# Patient Record
Sex: Female | Born: 1970
Health system: Southern US, Community
[De-identification: ages and names within clinical notes are randomized; demographics above are authoritative.]

## PROBLEM LIST (undated history)

## (undated) DIAGNOSIS — R9431 Abnormal electrocardiogram [ECG] [EKG]: Secondary | ICD-10-CM

## (undated) DIAGNOSIS — F419 Anxiety disorder, unspecified: Secondary | ICD-10-CM

## (undated) DIAGNOSIS — E079 Disorder of thyroid, unspecified: Secondary | ICD-10-CM

## (undated) DIAGNOSIS — I10 Essential (primary) hypertension: Secondary | ICD-10-CM

## (undated) DIAGNOSIS — F329 Major depressive disorder, single episode, unspecified: Secondary | ICD-10-CM

## (undated) DIAGNOSIS — Z8619 Personal history of other infectious and parasitic diseases: Secondary | ICD-10-CM

## (undated) DIAGNOSIS — F32A Depression, unspecified: Secondary | ICD-10-CM

## (undated) DIAGNOSIS — I160 Hypertensive urgency: Secondary | ICD-10-CM

## (undated) DIAGNOSIS — A630 Anogenital (venereal) warts: Secondary | ICD-10-CM

## (undated) DIAGNOSIS — R079 Chest pain, unspecified: Secondary | ICD-10-CM

## (undated) HISTORY — DX: Depression, unspecified: F32.A

## (undated) HISTORY — DX: Anogenital (venereal) warts: A63.0

## (undated) HISTORY — DX: Personal history of other infectious and parasitic diseases: Z86.19

## (undated) HISTORY — DX: Essential (primary) hypertension: I10

## (undated) HISTORY — PX: ABDOMINAL HYSTERECTOMY: SHX81

## (undated) HISTORY — DX: Anxiety disorder, unspecified: F41.9

## (undated) HISTORY — PX: THYROIDECTOMY: SHX17

## (undated) HISTORY — DX: Major depressive disorder, single episode, unspecified: F32.9

---
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
## 1998-07-04 ENCOUNTER — Other Ambulatory Visit: Admission: RE | Admit: 1998-07-04 | Discharge: 1998-07-04 | Payer: Self-pay | Admitting: Obstetrics and Gynecology

## 1999-07-03 ENCOUNTER — Other Ambulatory Visit: Admission: RE | Admit: 1999-07-03 | Discharge: 1999-07-03 | Payer: Self-pay | Admitting: Obstetrics and Gynecology

## 2000-09-16 ENCOUNTER — Other Ambulatory Visit: Admission: RE | Admit: 2000-09-16 | Discharge: 2000-09-16 | Payer: Self-pay | Admitting: Obstetrics and Gynecology

## 2002-01-31 ENCOUNTER — Encounter: Payer: Self-pay | Admitting: Emergency Medicine

## 2002-01-31 ENCOUNTER — Inpatient Hospital Stay (HOSPITAL_COMMUNITY): Admission: EM | Admit: 2002-01-31 | Discharge: 2002-02-02 | Payer: Self-pay | Admitting: Emergency Medicine

## 2003-02-01 ENCOUNTER — Other Ambulatory Visit: Admission: RE | Admit: 2003-02-01 | Discharge: 2003-02-01 | Payer: Self-pay | Admitting: Obstetrics and Gynecology

## 2004-04-05 ENCOUNTER — Other Ambulatory Visit: Admission: RE | Admit: 2004-04-05 | Discharge: 2004-04-05 | Payer: Self-pay | Admitting: Obstetrics and Gynecology

## 2005-10-25 ENCOUNTER — Inpatient Hospital Stay (HOSPITAL_COMMUNITY): Admission: AD | Admit: 2005-10-25 | Discharge: 2005-10-25 | Payer: Self-pay | Admitting: Obstetrics and Gynecology

## 2006-05-09 ENCOUNTER — Emergency Department (HOSPITAL_COMMUNITY): Admission: EM | Admit: 2006-05-09 | Discharge: 2006-05-09 | Payer: Self-pay | Admitting: Emergency Medicine

## 2006-12-25 ENCOUNTER — Encounter: Admission: RE | Admit: 2006-12-25 | Discharge: 2006-12-25 | Payer: Self-pay | Admitting: Obstetrics and Gynecology

## 2009-09-16 ENCOUNTER — Encounter: Admission: RE | Admit: 2009-09-16 | Discharge: 2009-09-16 | Payer: Self-pay | Admitting: Emergency Medicine

## 2010-08-25 NOTE — Discharge Summary (Signed)
Emma James, TILMON NO.:  [id number]   MEDICAL RECORD NO.:  [id number]                   PATIENT TYPE:  INP   LOCATION:  0443                                 FACILITY:  [facility]   PHYSICIAN:  Sherin Quarry, MD                   DATE OF BIRTH:  1970-12-14   DATE OF ADMISSION:  01/31/2002  DATE OF DISCHARGE:  02/02/2002                                 DISCHARGE SUMMARY   HISTORY OF PRESENT ILLNESS:  The patient is a 40 year old woman who  presented to the hospital on 01/31/02, with a 48 hour history of increased  coughing, wheezing, and chest congestion associated with chest tightness.  Her cough has been productive of a small amount of phlegm.  She came to the  emergency room because she had a particularly bad night the night before  with increased shortness of breath.  In the emergency room, she was noted to  be moderately hypoxic, and to have pneumonia on chest x-ray.  She was  admitted for IV antibiotics, breathing treatments, and steroids.   ALLERGIES:  1. PENICILLIN.  2. SULFA DRUGS.   MEDICATIONS:  Celexa 20 mg q.d.   PHYSICAL EXAMINATION:  VITAL SIGNS:  Blood pressure 137/88, heart rate 120,  respirations 18.  HEENT:  Within normal limits.  CHEST:  Remarkable for mild diffuse expiratory wheezing with congestion at  the right base.  CARDIOVASCULAR:  Heart rate of 120.  There were no murmurs, rubs, or  gallops.  ABDOMEN:  Benign.  Normal bowel sounds without masses, tenderness, or  organomegaly.  NEUROLOGIC:  Normal.  EXTREMITIES:  Normal.   LABORATORY DATA:  Chest x-ray showed evidence of a right basilar infiltrate  consistent with pneumonia.  CBC revealed a white count of 7300, hemoglobin  13.7.  Initial blood gas obtained in the emergency room revealed a PO2 of  65, PCO2 of 34, pH of 7.44.  Subsequently, the patient underwent a BMET  which showed a potassium of 4.2, glucose was 152, probably secondary to the  stress of illness and  steroid therapy.   HOSPITAL COURSE:  By 02/02/02, following an inpatient regimen which  consisted of Solu-Medrol 80 mg IVq.8h., Zithromax 500 mg q.24h., Rocephin 2  g q.24h., and nebulizer therapy with albuterol, the patient was much  improved and was felt reasonable to treat her as an outpatient.  Therefore,  on 02/02/02, the patient was discharged.   DISCHARGE DIAGNOSES:  1. Outpatient acquired pneumonia.  2. Asthmatic bronchitis.  3. A 15 pack year smoking history.  4. Depression.  5. Penicillin and sulfa allergies.   DISCHARGE MEDICATIONS:  1. Celexa 20 mg q.d.  2. Prednisone following a tapering schedule, i.e., 40 mg x3, 30 mg x3, 20 mg     x3, 10 mg x3, then stop.  3. Zithromax 250 mg.  4. Ceftin 500 mg b.i.d. x7 days.  5. Tussionex  p.r.n. cough.  6. Ventolin meter dosed inhaler 3 puffs q.i.d.   FOLLOWUP:  [facility] in five to seven days.   ACTIVITY:  The patient was instructed to remain off work until she returns  to [employer].                                                 Sherin Quarry, MD    SY/MEDQ  D:  02/02/2002  T:  02/02/2002  Job:  [id number]   cc:   [facility]

## 2011-08-13 ENCOUNTER — Ambulatory Visit: Payer: Self-pay | Admitting: Family Medicine

## 2011-08-13 DIAGNOSIS — F329 Major depressive disorder, single episode, unspecified: Secondary | ICD-10-CM

## 2011-08-13 DIAGNOSIS — F32A Depression, unspecified: Secondary | ICD-10-CM

## 2011-08-13 DIAGNOSIS — F341 Dysthymic disorder: Secondary | ICD-10-CM

## 2011-08-13 MED ORDER — SERTRALINE HCL 100 MG PO TABS: 100.0000 mg | ORAL_TABLET | Freq: Every day | ORAL | Status: DC

## 2011-08-13 NOTE — Progress Notes (Signed)
  Subjective:    Patient ID: Emma James, female    DOB: 09-May-1970, 41 y.o.   MRN: [id number]  HPI 41 yo female with depression/anxiety.  On zoloft for several years, prior to that on lexapro.  Total on meds about 15 years.  Doing well on zoloft.  Ran out 3 days ago, though, and has felt worse since running out.   No other complaints today.    Review of Systems Negative except as per HPI     Objective:   Physical Exam  Constitutional: She appears well-developed and well-nourished.  Cardiovascular: Normal rate, regular rhythm, normal heart sounds and intact distal pulses.   No murmur heard. Pulmonary/Chest: Effort normal and breath sounds normal.  Neurological: She is alert.  Skin: Skin is warm and dry.          Assessment & Plan:  Depression/anxiety - refilled zoloft for 1 year.

## 2011-08-23 ENCOUNTER — Ambulatory Visit: Payer: Self-pay | Admitting: Physician Assistant

## 2011-08-23 ENCOUNTER — Encounter: Payer: Self-pay | Admitting: Physician Assistant

## 2011-08-23 DIAGNOSIS — L659 Nonscarring hair loss, unspecified: Secondary | ICD-10-CM

## 2011-08-23 DIAGNOSIS — F419 Anxiety disorder, unspecified: Secondary | ICD-10-CM

## 2011-08-23 DIAGNOSIS — F411 Generalized anxiety disorder: Secondary | ICD-10-CM

## 2011-08-23 MED ORDER — CLONAZEPAM 0.5 MG PO TABS: 0.5000 mg | ORAL_TABLET | Freq: Two times a day (BID) | ORAL | Status: DC | PRN

## 2011-08-23 NOTE — Progress Notes (Signed)
  Subjective:    Patient ID: Emma James, female    DOB: October 01, 1970, 41 y.o.   MRN: [id number]  HPI Patient presents with concerns about hair loss. States she has had a history of similar episodes with exacerbations of her anxiety. States she has been taking Lexapro and has tolerated it well. However she was out for 3 days before she was able to get her last refill, and on the 3rd day she felt like she was having increased anxiety.  Says that her anxiety worsens with these episodes of hair loss to the point that she is having nightmares and heart palpitations. Reports history of Klonopin prn which she tolerated well. Has seen a dermatologist in the past for evaluation of her hair loss which was treated with what she believes to have been steroid injections to promote growth. Subsequently her hair grew back and she had no further problems until now.     Review of Systems  Constitutional: Negative for unexpected weight change.  Respiratory: Negative for chest tightness.   Cardiovascular: Positive for palpitations.  Skin:       Hair loss        Objective:   Physical Exam  Vitals reviewed. Constitutional: She appears well-developed and well-nourished.  HENT:  Head: Normocephalic and atraumatic.  Eyes: Conjunctivae are normal.  Cardiovascular: Normal rate, regular rhythm and normal heart sounds.   Pulmonary/Chest: Effort normal and breath sounds normal.  Skin: Skin is warm and dry.     Psychiatric: Her speech is normal and behavior is normal. Judgment and thought content normal. Her mood appears anxious. Cognition and memory are normal.          Assessment & Plan:  1. Alopecia  Reasurrance provided. If symptoms worsen consider dermatology referral if able, however  likely will be self-limited 2. Anxiety, unspecified  Increase to Zoloft 150 mg daily, so she will be taking 1 1/2 of her current medication. She will  continue this for 4 weeks and follow up at that time via phone  call. If tolerating well, ok to send  new prescription with instructions to take 150 mg daily

## 2011-08-23 NOTE — Progress Notes (Signed)
Precepted with Ms. Marte, PA-C and agree.  

## 2011-09-04 ENCOUNTER — Telehealth: Payer: Self-pay

## 2011-09-04 MED ORDER — SERTRALINE HCL 100 MG PO TABS: ORAL_TABLET | ORAL | Status: DC

## 2011-09-04 NOTE — Telephone Encounter (Signed)
PT STATES CHELLE HAD INCREASED HER ZOLOFT AND NOW SHE IS OUT, THE PHARMACY STATES IT WAS DENIED, BUT THEY DIDN'T REALIZED IT HAD BEEN INCREASED SO SHE HAD TO  TAKE MORE. PLEASE CALL [phone]    [facility] [address]

## 2011-09-04 NOTE — Telephone Encounter (Signed)
Done. Please remind patient to call as directed with an update.

## 2011-09-05 NOTE — Telephone Encounter (Signed)
LMOM to call back

## 2011-09-05 NOTE — Telephone Encounter (Signed)
Told pt that new Rx for Zoloft was sent yesterday w/ new sig and #45 to last the month.

## 2011-09-08 ENCOUNTER — Other Ambulatory Visit: Payer: Self-pay | Admitting: *Deleted

## 2011-09-08 MED ORDER — CLONAZEPAM 0.5 MG PO TABS: 0.5000 mg | ORAL_TABLET | Freq: Two times a day (BID) | ORAL | Status: DC | PRN

## 2011-09-10 ENCOUNTER — Other Ambulatory Visit: Payer: Self-pay | Admitting: Physician Assistant

## 2011-09-10 MED ORDER — CLONAZEPAM 0.5 MG PO TABS: 0.5000 mg | ORAL_TABLET | Freq: Two times a day (BID) | ORAL | Status: DC | PRN

## 2011-09-26 ENCOUNTER — Telehealth: Payer: Self-pay

## 2011-09-26 NOTE — Telephone Encounter (Signed)
PT STATES SHE HAD SEEN CHELLE AND WAS TO CALL BACK IN A MONTH AND CHECK IN WITH HER TO GIVE UPDATE ON HOW SHE IS DOING PLEASE CALL [phone]

## 2011-09-26 NOTE — Telephone Encounter (Signed)
Spoke with pt she is doing a lot better but she does think she can feel more better. She is having to take 2 klonopin at night to go to sleep. She thinks we should increase her Zoloft, she is on 150 mg for the last month. Please advise

## 2011-09-26 NOTE — Telephone Encounter (Signed)
LMOM to CB. 

## 2011-09-26 NOTE — Telephone Encounter (Signed)
LMOM to call back

## 2011-09-26 NOTE — Telephone Encounter (Signed)
Ok to increase Zoloft to 200 mg daily.  Zoloft (sertraline) 100 mg, 2 PO QD, #60, RF x 3

## 2011-09-27 ENCOUNTER — Telehealth: Payer: Self-pay | Admitting: Family Medicine

## 2011-09-27 MED ORDER — SERTRALINE HCL 100 MG PO TABS: ORAL_TABLET | ORAL | Status: DC

## 2011-09-27 NOTE — Telephone Encounter (Signed)
[name], I'm forwarding this to you, as you last saw her.

## 2011-09-27 NOTE — Telephone Encounter (Signed)
[facility] LMOM to notify pt.

## 2011-09-27 NOTE — Telephone Encounter (Signed)
Spoke with pt she would also like an increase in Klonopin also since she has to take two to go to sleep. Please advise. Zoloft already sent to pharmacy, see previous note

## 2011-09-28 MED ORDER — CLONAZEPAM 1 MG PO TABS: 1.0000 mg | ORAL_TABLET | Freq: Every day | ORAL | Status: DC | PRN

## 2011-09-28 NOTE — Telephone Encounter (Signed)
Called in [facility] [address] and notified pt

## 2011-09-28 NOTE — Telephone Encounter (Signed)
Klonopin increased to 1 mg daily as needed.

## 2011-09-28 NOTE — Telephone Encounter (Signed)
Received call from [facility] advising [place] that they are not filling Rx for Klonopin d/t pt has already p/up #60 of the 0.5 mg on 6/1 and another #60 of the ) 0.5 on 6/[facility]. They will put Rx on hold for pt, but even at increased dose she should not be out yet. [name], I am forwarding this to you [facility]

## 2011-10-05 ENCOUNTER — Telehealth: Payer: Self-pay

## 2011-10-05 NOTE — Telephone Encounter (Signed)
PT WOULD LIKE TO SPEAK WITH A CLINICAL TL, STATES HER MEDICINE IS ALL SCREWED UP. WENT TO THE PHARMACY AND WHEN SHE GOT THEM, THAT IS WHEN SHE REALIZED THEY WERE MESSED UP. PLEASE CALL [phone]

## 2011-10-05 NOTE — Telephone Encounter (Signed)
Emma James , see also phone message from 6/20

## 2011-10-05 NOTE — Telephone Encounter (Signed)
Clarified with Pharmacy: Clonazepam 0.5 mg 1/2 tab QAM, 1/2 tab Qmidday, 2 tabs PO QHS.  Zoloft 100 mg 2 PO QD

## 2011-10-05 NOTE — Telephone Encounter (Signed)
[facility] called and asked whether Rx written on 6/21 should have been for 1 mg BID instead of 1 mg QD since she had been taking the 0.5 BID. She stated that the pt has amnesia about picking up medication. She doesn't remember that she p/up and signed/showed ID for Xanax twice already in June. They are uncomfortable filling this Rx bc they stated that pt should still not be out even if taking twice as much of the 0.5 she p/up in June. Spoke w/[name] who stated that pt really needs to RTC to follow-up w/Chelle if there is this much confusion.

## 2011-10-05 NOTE — Telephone Encounter (Signed)
Pt reports that the Zoloft Rx was sent in w/correct # for her to take two tabs QD for total of 200 mg Chelle was going to inc her to, but the sig was left the same to take 1 and 1/2, so the pharmacy dispensed #45. She would appreciate it if we can straighten that out so that she can inc that and she is hoping that she won't have to cont taking the inc amount of Klonopin Qhs. She reports she has been taking 1/2 of 0.5 mg Qam, 1/2 of 0.5 mg Q mid-day and 2 0.5 tabs Qhs. She is not out, but will be soon bc she has had to inc the nightly dose. Pt requests [place] to make the changes w/out OV if possible bc she has no insurance and Chelle had said she should report by phone in a month to avoid the extra OV. Pt is moving [place]OT tomorrow, but will be coming back and forth for a couple of months.

## 2011-12-01 ENCOUNTER — Telehealth: Payer: Self-pay

## 2011-12-01 MED ORDER — SERTRALINE HCL 100 MG PO TABS: ORAL_TABLET | ORAL | Status: DC

## 2011-12-01 MED ORDER — CLONAZEPAM 1 MG PO TABS: 1.0000 mg | ORAL_TABLET | Freq: Every day | ORAL | Status: DC | PRN

## 2011-12-01 NOTE — Telephone Encounter (Signed)
Zoloft sent with refills through 08/2012, one year from her last visit.  Rx for clonazepam printed.

## 2011-12-01 NOTE — Telephone Encounter (Signed)
Pt wants to know if chelle can write a her zoloft rx for a year and also needs refill on clonazepam Please call pt to advise

## 2011-12-02 NOTE — Telephone Encounter (Signed)
LMOM Rx sent to pharmacy

## 2012-01-09 ENCOUNTER — Other Ambulatory Visit: Payer: Self-pay

## 2012-01-09 MED ORDER — CLONAZEPAM 1 MG PO TABS: 1.0000 mg | ORAL_TABLET | Freq: Every day | ORAL | Status: DC | PRN

## 2012-03-08 ENCOUNTER — Telehealth: Payer: Self-pay

## 2012-03-08 NOTE — Telephone Encounter (Signed)
Recd a phone call from [place] [facility] [place]. States pt is requesting a refill on clonazepam 1mg  daily as needed. Per pharmacy, last filled on 02/06/12 Please call pharmacy at [phone]

## 2012-03-11 NOTE — Telephone Encounter (Signed)
Chelle- I am going to forward this on to you, I saw her last, but she is your patient. Probably will need an OV soon.

## 2012-03-12 ENCOUNTER — Telehealth: Payer: Self-pay

## 2012-03-12 NOTE — Telephone Encounter (Signed)
[phone]  clonazePAM (KLONOPIN) 1 MG tablet  Pharmacist is requesting refill in [place] for patient to get her meds refilled.  Phone is [phone]  Sent faxes beginning 03/08/12.

## 2012-03-13 ENCOUNTER — Telehealth: Payer: Self-pay

## 2012-03-13 NOTE — Telephone Encounter (Signed)
Pt CB to check status of request for RF on her Clonazepam. Chelle, please see phone message from 03/08/12.

## 2012-03-13 NOTE — Telephone Encounter (Signed)
This issue is being addressed in 03/08/12 phone message.

## 2012-03-14 MED ORDER — CLONAZEPAM 1 MG PO TABS: 1.0000 mg | ORAL_TABLET | Freq: Every day | ORAL | Status: DC | PRN

## 2012-03-14 NOTE — Telephone Encounter (Signed)
Sorry for the delay-I didn't get her original request! I've printed the Rx, but she needs to see me for additional fills.  When will she be back from [place]?

## 2012-03-14 NOTE — Telephone Encounter (Signed)
See other message

## 2012-03-14 NOTE — Telephone Encounter (Signed)
[facility] [place] and notified pt. Pt reported that she is living now in [place], but will be here in town the weekend after Christmas. I advised pt that she should est care where she is living, but we will be happy to see her that weekend. Advised that Chelle is not working that [employer], but Herbert Seta is. Pt agreed.

## 2015-01-25 ENCOUNTER — Ambulatory Visit (INDEPENDENT_AMBULATORY_CARE_PROVIDER_SITE_OTHER): Payer: Self-pay | Admitting: Physician Assistant

## 2015-01-25 ENCOUNTER — Encounter: Payer: Self-pay | Admitting: Physician Assistant

## 2015-01-25 VITALS — BP 123/82 | HR 83 | Temp 98.7°F | Resp 16 | Wt 140.2 lb

## 2015-01-25 DIAGNOSIS — F418 Other specified anxiety disorders: Secondary | ICD-10-CM

## 2015-01-25 DIAGNOSIS — F341 Dysthymic disorder: Secondary | ICD-10-CM

## 2015-01-25 DIAGNOSIS — F5105 Insomnia due to other mental disorder: Secondary | ICD-10-CM

## 2015-01-25 MED ORDER — SERTRALINE HCL 100 MG PO TABS: 200.0000 mg | ORAL_TABLET | Freq: Every day | ORAL | Status: DC

## 2015-01-25 MED ORDER — TRAZODONE HCL 150 MG PO TABS: 75.0000 mg | ORAL_TABLET | Freq: Every day | ORAL | Status: DC

## 2015-01-25 NOTE — Patient Instructions (Addendum)
[facility] - for sliding scale
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
## 2015-07-14 ENCOUNTER — Ambulatory Visit: Payer: Self-pay | Admitting: Physician Assistant

## 2015-07-27 ENCOUNTER — Ambulatory Visit: Payer: Self-pay | Admitting: Physician Assistant

## 2015-07-28 ENCOUNTER — Ambulatory Visit: Payer: Self-pay | Admitting: Physician Assistant

## 2015-08-04 ENCOUNTER — Other Ambulatory Visit: Payer: Self-pay | Admitting: Physician Assistant

## 2015-09-20 ENCOUNTER — Ambulatory Visit (INDEPENDENT_AMBULATORY_CARE_PROVIDER_SITE_OTHER): Payer: Self-pay | Admitting: Emergency Medicine

## 2015-09-20 VITALS — BP 122/76 | HR 79 | Temp 98.4°F | Resp 16 | Ht 64.5 in | Wt 144.0 lb

## 2015-09-20 DIAGNOSIS — F341 Dysthymic disorder: Secondary | ICD-10-CM

## 2015-09-20 DIAGNOSIS — F418 Other specified anxiety disorders: Secondary | ICD-10-CM

## 2015-09-20 DIAGNOSIS — F329 Major depressive disorder, single episode, unspecified: Secondary | ICD-10-CM

## 2015-09-20 DIAGNOSIS — F32A Depression, unspecified: Secondary | ICD-10-CM

## 2015-09-20 DIAGNOSIS — F5105 Insomnia due to other mental disorder: Secondary | ICD-10-CM

## 2015-09-20 MED ORDER — SERTRALINE HCL 100 MG PO TABS: 200.0000 mg | ORAL_TABLET | Freq: Every day | ORAL | Status: DC

## 2015-09-20 MED ORDER — TRAZODONE HCL 150 MG PO TABS: 75.0000 mg | ORAL_TABLET | Freq: Every day | ORAL | Status: DC

## 2015-09-20 NOTE — Patient Instructions (Addendum)
Please follow-up with Emma LennertSarah James in 6 months    IF you received an x-ray today, you will receive an invoice from [facility]. Please contact [facility] at [phone] with questions or concerns regarding your invoice.   IF you received labwork today, you will receive an invoice from [facility]. Please contact [facility] at [phone] with questions or concerns regarding your invoice.   Our billing staff will not be able to assist you with questions regarding bills from these companies.  You will be contacted with the lab results as soon as they are available. The fastest way to get your results is to activate your My Chart account. Instructions are located on the last page of this paperwork. If you have not heard from us regarding the results in 2 weeks, please contact this office.
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
## 2015-10-28 ENCOUNTER — Ambulatory Visit (HOSPITAL_COMMUNITY)
Admission: RE | Admit: 2015-10-28 | Discharge: 2015-10-28 | Disposition: A | Discharge status: Home / Self Care | Payer: Self-pay | Source: Ambulatory Visit | Attending: Obstetrics and Gynecology | Admitting: Obstetrics and Gynecology

## 2015-10-28 ENCOUNTER — Encounter (HOSPITAL_COMMUNITY): Payer: Self-pay | Admitting: *Deleted

## 2015-10-28 DIAGNOSIS — Z130 Encounter for screening for diseases of the blood and blood-forming organs and certain disorders involving the immune mechanism: Secondary | ICD-10-CM | POA: Insufficient documentation

## 2015-10-28 DIAGNOSIS — D649 Anemia, unspecified: Secondary | ICD-10-CM | POA: Insufficient documentation

## 2015-10-28 LAB — GLUCOSE, CAPILLARY: Glucose-Capillary: 99 mg/dL (ref 65–99)

## 2015-10-28 MED ORDER — SODIUM CHLORIDE 0.9 % IV SOLN
510.0000 mg | Freq: Once | INTRAVENOUS | Status: AC
  Administered 2015-10-28: 510 mg via INTRAVENOUS
  Filled 2015-10-28: qty 17

## 2015-10-28 MED ORDER — LACTATED RINGERS IV BOLUS (SEPSIS)
500.0000 mL | Freq: Once | INTRAVENOUS | Status: AC
Start: 2015-10-28 — End: 2015-10-28
  Administered 2015-10-28: 500 mL via INTRAVENOUS

## 2015-10-28 MED ORDER — LACTATED RINGERS IV SOLN: INTRAVENOUS | Status: DC

## 2015-10-28 NOTE — Progress Notes (Signed)
Pt ambulated with 2 person assist to bathroom; pt states legs are "buckling"; unstable gait observed; Pt alert, oriented; spoke with Amy at Dr. Lynnell DikeGrewal's office; will await new orders; will continue to monitor

## 2015-10-28 NOTE — Progress Notes (Signed)
Pt received 500ccs of lactated ringers and stated she felt better when she sat up. VSS, b/p 126/79, hr 85, temp 98.5, O2 sat 100% and respirations 16. Pt ambulated to bathroom with assistance, knees not as wobbly. The longer she was standing and walking the better her ambulation was. Pt denied discomfort. Able to walk on her own. Dr. Vincente PoliGrewal was notified regarding her situation and felt like patient could go home at this point. Pt was discharged but reminded to call EMS for any problems. Knows that she is to return on Monday for her next [place]. Pt voiced understanding.

## 2015-10-28 NOTE — Discharge Instructions (Signed)
Today, you received an IV infusion of Ferumoxytol (Feraheme), 510 mg.  Please contact your primary provider or the emergency room for any side effects noted below. Please contact your provider for follow-up.  Ferumoxytol injection What is this medicine? FERUMOXYTOL is an iron complex. Iron is used to make healthy red blood cells, which carry oxygen and nutrients throughout the body. This medicine is used to treat iron deficiency anemia in people with chronic kidney disease. This medicine may be used for other purposes; ask your health care provider or pharmacist if you have questions. What should I tell my health care provider before I take this medicine? They need to know if you have any of these conditions: -anemia not caused by low iron levels -high levels of iron in the blood -magnetic resonance imaging (MRI) test scheduled -an unusual or allergic reaction to iron, other medicines, foods, dyes, or preservatives -pregnant or trying to get pregnant -breast-feeding How should I use this medicine? This medicine is for injection into a vein. It is given by a health care professional in a hospital or clinic setting. Talk to your pediatrician regarding the use of this medicine in children. Special care may be needed. Overdosage: If you think you have taken too much of this medicine contact a poison control center or emergency room at once. NOTE: This medicine is only for you. Do not share this medicine with others. What if I miss a dose? It is important not to miss your dose. Call your doctor or health care professional if you are unable to keep an appointment. What may interact with this medicine? This medicine may interact with the following medications: -other iron products This list may not describe all possible interactions. Give your health care provider a list of all the medicines, herbs, non-prescription drugs, or dietary supplements you use. Also tell them if you smoke, drink alcohol,  or use illegal drugs. Some items may interact with your medicine. What should I watch for while using this medicine? Visit your doctor or healthcare professional regularly. Tell your doctor or healthcare professional if your symptoms do not start to get better or if they get worse. You may need blood work done while you are taking this medicine. You may need to follow a special diet. Talk to your doctor. Foods that contain iron include: whole grains/cereals, dried fruits, beans, or peas, leafy green vegetables, and organ meats (liver, kidney). What side effects may I notice from receiving this medicine? Side effects that you should report to your doctor or health care professional as soon as possible: -allergic reactions like skin rash, itching or hives, swelling of the face, lips, or tongue -breathing problems -changes in blood pressure -feeling faint or lightheaded, falls -fever or chills -flushing, sweating, or hot feelings -swelling of the ankles or feet Side effects that usually do not require medical attention (Report these to your doctor or health care professional if they continue or are bothersome.): -diarrhea -headache -nausea, vomiting -stomach pain This list may not describe all possible side effects. Call your doctor for medical advice about side effects. You may report side effects to FDA at 1-800-FDA-1088. Where should I keep my medicine? This drug is given in a hospital or clinic and will not be stored at home. NOTE: This sheet is a summary. It may not cover all possible information. If you have questions about this medicine, talk to your doctor, pharmacist, or health care provider.    2016, Elsevier/Gold Standard. (2011-11-09 15:23:36)

## 2015-10-28 NOTE — Procedures (Signed)
[facility]  Procedure Note  Cecille RubinCarolyn Springfield UEA:[id number] DOB: [birth year] DOA: 10/28/2015   PCP: Jeani HawkingGREWAL,MICHELLE L, MD   Associated Diagnosis: Anemia, Z-130  Procedure Note: IV infusion of Feraheme; LR infusion    Condition During Procedure: Pt reported dizziness and unstable gait 30 minutes post Feraheme infusion; MD notified and orders received for infusion of Lactated ringers; Pt tolerated well  Condition at Discharge: Pt alert, oriented, ambulatory; transported by wheelchair to car; no complications noted   Katrinka BlazingSmith, Joslyn HyBobbie White, RN  [facility]

## 2015-10-28 NOTE — Progress Notes (Signed)
Spoke with Alvino ChapelEllen at Dr. Lynnell DikeGrewal's office regarding Emma James. Dr. Vincente PoliGrewal gave orders for IV fluids.  Will cont to monitor patient.

## 2015-10-28 NOTE — Progress Notes (Signed)
Pt received IV infusion of Fereheme; pt was monitored for 30 minutes post infusion; upon discharge, pt stated felt dizzy and lightheaded; IV signs, 136/76 BP, 100% oxygen, 86 HR, 16 RR, 98.4 temp; Dr. Lynnell DikeGrewal's office notified; no new orders received; pt given fluids and food, will continue to monitor

## 2015-10-31 ENCOUNTER — Ambulatory Visit (HOSPITAL_COMMUNITY)
Admission: RE | Admit: 2015-10-31 | Discharge: 2015-10-31 | Disposition: A | Discharge status: Home / Self Care | Payer: Self-pay | Source: Ambulatory Visit | Attending: Obstetrics and Gynecology | Admitting: Obstetrics and Gynecology

## 2015-10-31 MED ORDER — SODIUM CHLORIDE 0.9 % IV SOLN
510.0000 mg | Freq: Once | INTRAVENOUS | Status: AC
  Administered 2015-10-31: 510 mg via INTRAVENOUS
  Filled 2015-10-31: qty 17

## 2015-10-31 NOTE — Procedures (Signed)
[facility]  Procedure Note  Emma James KGS:[id number] DOB: July 15, 1970 DOA: 10/31/2015   PCP: Jeani Hawking, MD   Associated Diagnosis: z130  Procedure Note: Iv started, iron infused per order, Iv discontinued   Condition During Procedure: patient stable, no discomforts noted   Condition at Discharge: patient stable at discharge, able to ambulate without difficulty.   Lanae Boast, RN  [facility]

## 2015-10-31 NOTE — Discharge Instructions (Signed)

## 2015-11-14 ENCOUNTER — Other Ambulatory Visit: Payer: Self-pay | Admitting: Obstetrics and Gynecology

## 2015-11-14 HISTORY — PX: TOTAL ABDOMINAL HYSTERECTOMY: SHX209

## 2015-12-06 ENCOUNTER — Other Ambulatory Visit: Payer: Self-pay

## 2015-12-06 DIAGNOSIS — Z8742 Personal history of other diseases of the female genital tract: Secondary | ICD-10-CM

## 2015-12-06 NOTE — Progress Notes (Signed)
Per provider pt needs to have US scheduled prior to GYN appt.  US scheduled for September 8th @ 0800.  Front office to contact pt with US and GYN appt.

## 2015-12-16 ENCOUNTER — Ambulatory Visit (HOSPITAL_COMMUNITY): Payer: Self-pay

## 2015-12-21 ENCOUNTER — Encounter: Payer: Self-pay | Admitting: Physical Therapy

## 2015-12-21 ENCOUNTER — Ambulatory Visit: Payer: Self-pay | Attending: Obstetrics and Gynecology | Admitting: Physical Therapy

## 2015-12-21 DIAGNOSIS — M62838 Other muscle spasm: Secondary | ICD-10-CM | POA: Insufficient documentation

## 2015-12-21 DIAGNOSIS — M6281 Muscle weakness (generalized): Secondary | ICD-10-CM | POA: Insufficient documentation

## 2015-12-21 NOTE — Therapy (Signed)
[facility] [address], [address] [place], [place], [address] Phone: [phone]   Fax:  [phone]  Physical Therapy Evaluation  Patient Details  Name: Sherman Lipuma MRN: [id number] Date of Birth: April 02, 1971 Referring Provider: Dr. Marcelle Overlie  Encounter Date: 12/21/2015      PT End of Session - 12/21/15 1616    Visit Number 1   Date for PT Re-Evaluation 04/21/16   PT Start Time 1530   PT Stop Time 1616   PT Time Calculation (min) 46 min   Activity Tolerance Patient tolerated treatment well   Behavior During Therapy [facility] for tasks assessed/performed      History reviewed. No pertinent past medical history.  Past Surgical History:  Procedure Laterality Date  . TOTAL ABDOMINAL HYSTERECTOMY  11/14/2015    There were no vitals filed for this visit.       Subjective Assessment - 12/21/15 1536    Subjective Patient had a total hysterectomy abdominally on 11/14/2015.  Patient strained her abdomen on 11/16/2015. Patietn did this by going to the bathroom and holding onto the towel rack with support and feel onto the toilet.  Patient pulled the left abdominal wall. Patient is not working at the time. She is still on the pain pills. Patient is 5 weeks recovery. Patient lives alone.    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 Yes   Pain Score 4   without pain 9/10   Pain Location Abdomen   Pain Orientation Left   Pain Descriptors / Indicators Sharp;Shooting;Stabbing  abdomen is numb   Pain Type Acute pain   Pain Radiating Towards pain runs to left toes   Pain Onset More than a month ago   Pain Frequency Constant   Aggravating Factors  movement   Pain Relieving Factors pain medication, be still   Multiple Pain Sites No            [facility] PT Assessment - 12/21/15 0001      Assessment   Medical Diagnosis Abdominal strain post op   Referring Provider Dr. Marcelle Overlie   Onset Date/Surgical Date 11/16/15   Prior Therapy None     Precautions   Precautions Other (comment)   Precaution Comments 5 weeks s/p hysterectomy     Restrictions   Weight Bearing Restrictions No     Balance Screen   Has the patient fallen in the past 6 months No   Has the patient had a decrease in activity level because of a fear of falling?  No   Is the patient reluctant to leave their home because of a fear of falling?  No     Home Environment   Living Environment Private residence  lives in apartment above garage   Available Help at Discharge Other (Comment)  none   Type of Home Apartment   Home Access Stairs to enter   Entrance Stairs-Number of Steps 12   Entrance Stairs-Rails Right   Home Layout One level  kitchen is in the house.      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#     Cognition   Overall Cognitive Status Within Functional Limits for tasks assessed     Observation/Other Assessments   Observations surgical site is healed with glu left on it   Focus on  Therapeutic Outcomes (FOTO)  88% limitation     ROM / Strength   AROM / PROM / Strength AROM;Strength     Strength   Left Hip ABduction 4/5     Palpation   Palpation comment tenderness located in left psoas; along the right and left  pubovesical ligament                 Pelvic Floor Special Questions - 12/21/15 0001    Urinary Leakage No                  PT Education - 12/21/15 1615    Education provided Yes   Education Details abdominal massage, toileting technique, scar massage, use of heat on the area   Person(s) Educated Patient   Methods Explanation;Demonstration;Handout   Comprehension Returned demonstration;Verbalized understanding          PT Short Term Goals - 12/21/15 1626      PT SHORT TERM GOAL #1   Title independent with initial  HEP   Time 4   Period Weeks   Status New     PT SHORT TERM GOAL #2   Title understand correct toileting technique due to constipation   Time 4   Period Weeks   Status New     PT SHORT TERM GOAL #3   Title understand abdominal massage to reduce constipation and reduce muscle pain   Time 4   Period Weeks   Status New     PT SHORT TERM GOAL #4   Title pain with movement decreased >/= 25%   Time 4   Period Weeks   Status New           PT Long Term Goals - 12/21/15 1639      PT LONG TERM GOAL #1   Title independent with HEP   Time 4   Period Months   Status New     PT LONG TERM GOAL #2   Title ability to move in bed with pain decresaed >/50%   Time 4   Period Months   Status New     PT LONG TERM GOAL #3   Title ability to lift 25# for work with correct body mechanics and pain decreased >/= 50%   Time 4   Period Months   Status New     PT LONG TERM GOAL #4   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  Patient had abdominal total hysterectomy on 11/14/2015. Patient was getting off the commode on 11/16/2015 and plopped on the commode causing a strain on the abdominal wall.   Patient reports her pain si 9/10 without pain medication and 3/10 with pain medication.  Patient pain is worse with abdominal movement.  Patient reports she is unable to drive due to her having to take pain medication regularly.  Patient is contipated due to pain medication.  Patient surgical site is healed sith glue on it.  Tenderness is located on left psoas, along the left abdominal wall, and left pubovesical ligament.  Swelling in noted in the abdomen.  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  Bilateral hip abduction  is 4/5.  Abdominal strength is 1/5.  Patient has difficulty with moving in bed due to engaging her abdomen and causing pain. Patient is of moderate complex evaluation due to an evolving condition and comorbidities such as depression, s/p vaginal total hysterectomy on 11/14/2015 that will impact care provided.  Patient will benefit form skilled therapy to reduce pain, improve strength and improve mobility to return to work.    Rehab Potential Excellent   Clinical Impairments Affecting Rehab Potential total abdominal hysterectomy on 11/14/2015   PT Frequency 1x / week   PT Duration Other (comment)  4 months   PT Treatment/Interventions Cryotherapy;Electrical Stimulation;Ultrasound;Moist Heat;Therapeutic activities;Therapeutic exercise;Neuromuscular re-education;Patient/family education;Passive range of motion;Scar mobilization;Manual techniques;Manual lymph drainage;Energy conservation;Taping   PT Next Visit Plan scar mobilization; soft tissue work, abdominal bracing, hip abduction strength; body mechanics; lymph drainage techniques for the abodmen   PT Home Exercise Plan strengthening   Recommended Other Services None   Consulted and Agree with Plan of Care Patient      Patient will benefit from skilled therapeutic intervention in order to improve the following deficits and impairments:  Difficulty walking, Increased fascial restricitons, Decreased endurance, Increased muscle spasms, Decreased activity tolerance, Decreased mobility, Decreased strength, Decreased scar mobility, Improper body mechanics  Visit Diagnosis: Muscle weakness (generalized) - Plan: PT plan of care cert/re-cert  Other muscle spasm - Plan: PT plan of care cert/re-cert     Problem List Patient Active Problem List   Diagnosis Date Noted  . Depression with anxiety 01/25/2015  . Insomnia secondary to depression with anxiety 01/25/2015    Eulis Fosterheryl Evany Schecter, PT 12/21/15 4:45 PM   [facility] [address] [place], [place], [address] Phone: [phone]   Fax:  [phone]  Name: Cecille RubinCarolyn Stefanelli MRN: [id number] Date of Birth: 10/05/1970

## 2015-12-21 NOTE — Patient Instructions (Signed)
Toileting Techniques for Bowel Movements (Defecation) Using your belly (abdomen) and pelvic floor muscles to have a bowel movement is usually instinctive.  Sometimes people can have problems with these muscles and have to relearn proper defecation (emptying) techniques.  If you have weakness in your muscles, organs that are falling out, decreased sensation in your pelvis, or ignore your urge to go, you may find yourself straining to have a bowel movement.  You are straining if you are: . holding your breath or taking in a huge gulp of air and holding it  . keeping your lips and jaw tensed and closed tightly . turning red in the face because of excessive pushing or forcing . developing or worsening your  hemorrhoids . getting faint while pushing . not emptying completely and have to defecate many times a day  If you are straining, you are actually making it harder for yourself to have a bowel movement.  Many people find they are pulling up with the pelvic floor muscles and closing off instead of opening the anus. Due to lack pelvic floor relaxation and coordination the abdominal muscles, one has to work harder to push the feces out.  Many people have never been taught how to defecate efficiently and effectively.  Notice what happens to your body when you are having a bowel movement.  While you are sitting on the toilet pay attention to the following areas: . Jaw and mouth position . Angle of your hips   . Whether your feet touch the ground or not . Arm placement  . Spine position . Waist . Belly tension . Anus (opening of the anal canal)  An Evacuation/Defecation Plan   Here are the 4 basic points:  1. Lean forward enough for your elbows to rest on your knees 2. Support your feet on the floor or use a low stool if your feet don't touch the floor  3. Push out your belly as if you have swallowed a beach ball-you should feel a widening of your waist 4. Open and relax your pelvic floor muscles,  rather than tightening around the anus      The following conditions my require modifications to your toileting posture:  . If you have had surgery in the past that limits your back, hip, pelvic, knee or ankle flexibility . Constipation   Your healthcare practitioner may make the following additional suggestions and adjustments:  1) Sit on the toilet  a) Make sure your feet are supported. b) Notice your hip angle and spine position-most people find it effective to lean forward or raise their knees, which can help the muscles around the anus to relax  c) When you lean forward, place your forearms on your thighs for support  2) Relax suggestions a) Breath deeply in through your nose and out slowly through your mouth as if you are smelling the flowers and blowing out the candles. b) To become aware of how to relax your muscles, contracting and releasing muscles can be helpful.  Pull your pelvic floor muscles in tightly by using the image of holding back gas, or closing around the anus (visualize making a circle smaller) and lifting the anus up and in.  Then release the muscles and your anus should drop down and feel open. Repeat 5 times ending with the feeling of relaxation. c) Keep your pelvic floor muscles relaxed; let your belly bulge out. d) The digestive tract starts at the mouth and ends at the anal opening, so be   sure to relax both ends of the tube.  Place your tongue on the roof of your mouth with your teeth separated.  This helps relax your mouth and will help to relax the anus at the same time.  3) Empty (defecation) a) Keep your pelvic floor and sphincter relaxed, then bulge your anal muscles.  Make the anal opening wide.  b) Stick your belly out as if you have swallowed a beach ball. c) Make your belly wall hard using your belly muscles while continuing to breathe. Doing this makes it easier to open your anus. d) Breath out and give a grunt (or try using other sounds such as  ahhhh, shhhhh, ohhhh or grrrrrrr).  4) Finish a) As you finish your bowel movement, pull the pelvic floor muscles up and in.  This will leave your anus in the proper place rather than remaining pushed out and down. If you leave your anus pushed out and down, it will start to feel as though that is normal and give you incorrect signals about needing to have a bowel movement.    Emma James, PT [facility] [address] [place], [place] [address] Scar Massage  Scar massage is done to improve the mobility of scar, decrease scar tissue from building up, reduce adhesions, and prevent Keloids from forming. Start scar massage after scabs have fallen off by themselves and no open areas. The first few weeks after surgery, it is normal for a scar to appear pink or red and slightly raised. Scars can itch or have areas of numbness. Some scars may be sensitive.   Direct Scar massage: after scar is healed, no opening, no scab 1.  Place pads of two fingers together directly on the scar starting at one end of the scar. Move the fingers up and down across the scar holding 5 seconds one direction.  Then go opposite direction hold 5 seconds.  2. Move over to the next section of the scar and repeat.  Work your way along the entire length of the scar.   3. Next make diagonal movements along the scar holding 5 seconds at one direction. 4. Next movement is side to side. 5. Do not rub fingers over the scar.  Instead keep firm pressure and move scar over the tissue it is on top   Scar Lift and Roll 12 weeks after surgery. 1. Pinch a small amount of the scar between your first two fingers and thumb.  2. Roll the scar between your fingers for 5 to 15 seconds. 3. Move along the scar and repeat until you have massaged the entire length of scar.   Stop the massage and call your doctor if you notice: 1. Increased redness 2. Bleeding from scar 3. Seepage coming from the scar 4. Scar is  warmer and has increased pain   About Abdominal Massage  Abdominal massage, also called external colon massage, is a self-treatment circular massage technique that can reduce and eliminate gas and ease constipation. The colon naturally contracts in waves in a clockwise direction starting from inside the right hip, moving up toward the ribs, across the belly, and down inside the left hip.  When you perform circular abdominal massage, you help stimulate your colon's normal wave pattern of movement called peristalsis.  It is most beneficial when done after eating.  Positioning You can practice abdominal massage with oil while lying down, or in the shower with soap.  Some people find that it is just  as effective to do the massage through clothing while sitting or standing.  How to Massage Start by placing your finger tips or knuckles on your right side, just inside your hip bone.  . Make small circular movements while you move upward toward your rib cage.   . Once you reach the bottom right side of your rib cage, take your circular movements across to the left side of the bottom of your rib cage.  . Next, move downward until you reach the inside of your left hip bone.  This is the path your feces travel in your colon. . Continue to perform your abdominal massage in this pattern for 10 minutes each day.     You can apply as much pressure as is comfortable in your massage.  Start gently and build pressure as you continue to practice.  Notice any areas of pain as you massage; areas of slight pain may be relieved as you massage, but if you have areas of significant or intense pain, consult with your healthcare provider.  Other Considerations . General physical activity including bending and stretching can have a beneficial massage-like effect on the colon.  Deep breathing can also stimulate the colon because breathing deeply activates the same nervous system that supplies the colon.   . Abdominal  massage should always be used in combination with a bowel-conscious diet that is high in the proper type of fiber for you, fluids (primarily water), and a regular exercise program.   Heat Therapy Heat therapy can help ease sore, stiff, injured, and tight muscles and joints. Heat relaxes your muscles, which may help ease your pain.  RISKS AND COMPLICATIONS If you have any of the following conditions, do not use heat therapy unless your health care provider has approved:  Poor circulation.  Healing wounds or scarred skin in the area being treated.  Diabetes, heart disease, or high blood pressure.  Not being able to feel (numbness) the area being treated.  Unusual swelling of the area being treated.  Active infections.  Blood clots.  Cancer.  Inability to communicate pain. This may include young children and people who have problems with their brain function (dementia).  Pregnancy. Heat therapy should only be used on old, pre-existing, or long-lasting (chronic) injuries. Do not use heat therapy on new injuries unless directed by your health care provider. HOW TO USE HEAT THERAPY There are several different kinds of heat therapy, including:  Moist heat pack.  Warm water bath.  Hot water bottle.  Electric heating pad.  Heated gel pack.  Heated wrap.  Electric heating pad. Use the heat therapy method suggested by your health care provider. Follow your health care provider's instructions on when and how to use heat therapy. GENERAL HEAT THERAPY RECOMMENDATIONS  Do not sleep while using heat therapy. Only use heat therapy while you are awake.  Your skin may turn pink while using heat therapy. Do not use heat therapy if your skin turns red.  Do not use heat therapy if you have new pain.  High heat or long exposure to heat can cause burns. Be careful when using heat therapy to avoid burning your skin.  Do not use heat therapy on areas of your skin that are already  irritated, such as with a rash or sunburn. SEEK MEDICAL CARE IF:  You have blisters, redness, swelling, or numbness.  You have new pain.  Your pain is worse. MAKE SURE YOU:  Understand these instructions.  Will watch your condition.  Will get help right away if you are not doing well or get worse.   This information is not intended to replace advice given to you by your health care provider. Make sure you discuss any questions you have with your health care provider.   Document Released: 06/18/2011 Document Revised: 04/16/2014 Document Reviewed: 05/19/2013 Elsevier Interactive Patient Education 2016 [employer] [facility] [address], [address] [place], [place] [address] Phone # [phone] Fax [phone]

## 2015-12-26 ENCOUNTER — Ambulatory Visit: Payer: Self-pay | Admitting: Physical Therapy

## 2015-12-28 ENCOUNTER — Ambulatory Visit: Payer: Self-pay | Admitting: Physical Therapy

## 2015-12-28 ENCOUNTER — Encounter: Payer: Self-pay | Admitting: Physical Therapy

## 2015-12-28 DIAGNOSIS — M62838 Other muscle spasm: Secondary | ICD-10-CM

## 2015-12-28 DIAGNOSIS — M6281 Muscle weakness (generalized): Secondary | ICD-10-CM

## 2015-12-28 NOTE — Patient Instructions (Signed)
Isometric Hold With Pelvic Floor (Hook-Lying)    Lie with hips and knees bent. Slowly inhale, and then exhale. Pull navel toward spine and tighten pelvic floor. Hold for _5__ seconds. Continue to breathe in and out during hold. Rest for ___ seconds. Repeat _10__ times. Do __2_ times a day.  Copyright  VHI. All rights reserved.  [facility] [address], [address] [place], [place] [address] Phone # [phone] Fax [phone]

## 2015-12-28 NOTE — Therapy (Addendum)
[facility] [address] [place], [place], [address] Phone: [phone]   Fax:  [phone]  Physical Therapy Treatment  Patient Details  Name: Emma James MRN: [id number] Date of Birth: July 06, 1970 Referring Provider: Dr. Dian Queen  Encounter Date: 12/28/2015      PT End of Session - 12/28/15 1400    Visit Number 2   Date for PT Re-Evaluation 04/21/16   PT Start Time 1100   PT Stop Time 1145   PT Time Calculation (min) 45 min   Activity Tolerance Patient tolerated treatment well   Behavior During Therapy [facility] for tasks assessed/performed      History reviewed. No pertinent past medical history.  Past Surgical History:  Procedure Laterality Date  . TOTAL ABDOMINAL HYSTERECTOMY  11/14/2015    There were no vitals filed for this visit.      Subjective Assessment - 12/28/15 1106    Subjective The past 4 days I have been in the bathroom for hours trying to have a bowel movement.  I felt good after the last visit. I have to wean myself off the pain medication due to going back to work next week.     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 Yes   Pain Score 3    Pain Location Abdomen   Pain Orientation Left   Pain Descriptors / Indicators Shooting;Sharp;Stabbing   Pain Type Acute pain   Pain Onset More than a month ago   Pain Frequency Constant   Aggravating Factors  movement   Pain Relieving Factors pain medication, be still   Multiple Pain Sites No                         [facility] Adult PT Treatment/Exercise - 12/28/15 0001      Manual Therapy   Manual Therapy Soft tissue mobilization;Myofascial release;Manual Lymphatic Drainage (MLD)   Soft tissue mobilization abdominal circular massage to stimulate perstalic motion of the colon; diahgramatic soft tissue work; pull up on abdonmen an dgo through planes fascia; scar massage to lower abdomen  with up and down movement   Myofascial Release on hand on abdoment and other on back listneing to 3 planes of fascia   Manual Lymphatic Drainage (MLD) Superficial abdominal lymphatics colon stimulation; Deep lymphatic abdominal stimulation                  PT Short Term Goals - 12/28/15 1405      PT SHORT TERM GOAL #1   Title independent with initial HEP   Time 4   Period Weeks   Status On-going     PT SHORT TERM GOAL #2   Title understand correct toileting technique due to constipation   Time 4   Period Weeks   Status On-going     PT SHORT TERM GOAL #3   Title understand abdominal massage to reduce constipation and reduce muscle pain   Time 4   Period Weeks   Status On-going     PT SHORT TERM GOAL #4   Title pain with movement decreased >/= 25%   Time 4   Period Weeks   Status On-going           PT Long Term Goals - 12/21/15 1639      PT LONG TERM GOAL #1   Title independent with HEP   Time 4   Period  Months   Status New     PT LONG TERM GOAL #2   Title ability to move in bed with pain decresaed >/50%   Time 4   Period Months   Status New     PT LONG TERM GOAL #3   Title ability to lift 25# for work with correct body mechanics and pain decreased >/= 50%   Time 4   Period Months   Status New     PT LONG TERM GOAL #4   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  Patient is having difficulty with bowel movements due to constipation from pain pills.  Patient is not driving yet.  Patient is independent with her current HEP.  Patient is trying to wean herself off the pain medication due to starting work next week. Patient has difficulty with abdominal contraction for bracing with movements.  Patient had swelling in the  abdomen but after the lymph drainage technique the swelling went down. Patient is doing the toileting technique but still has to strain. Patient has not met goals due to just starting therapy. Patient will benefit from physical therapy to reduce pain and improve mobility.    Rehab Potential Excellent   Clinical Impairments Affecting Rehab Potential total abdominal hysterectomy on 11/14/2015   PT Frequency 1x / week   PT Duration Other (comment)  4 months   PT Treatment/Interventions Cryotherapy;Electrical Stimulation;Ultrasound;Moist Heat;Therapeutic activities;Therapeutic exercise;Neuromuscular re-education;Patient/family education;Passive range of motion;Scar mobilization;Manual techniques;Manual lymph drainage;Energy conservation;Taping   PT Next Visit Plan scar mobilization; soft tissue work, abdominal bracing, hip abduction strength; body mechanics;   PT Home Exercise Plan strengthening   Consulted and Agree with Plan of Care Patient      Patient will benefit from skilled therapeutic intervention in order to improve the following deficits and impairments:  Difficulty walking, Increased fascial restricitons, Decreased endurance, Increased muscle spasms, Decreased activity tolerance, Decreased mobility, Decreased strength, Decreased scar mobility, Improper body mechanics  Visit Diagnosis: Muscle weakness (generalized)  Other muscle spasm     Problem List Patient Active Problem List   Diagnosis Date Noted  . Depression with anxiety 01/25/2015  . Insomnia secondary to depression with anxiety 01/25/2015    Earlie Counts, PT 12/28/15 2:06 PM   [facility] [address] [place], [place], [address] Phone: [phone]   Fax:  [phone]  Name: Emma James MRN: [id number] Date of Birth: [birth year]  PHYSICAL THERAPY DISCHARGE SUMMARY  Visits from Start of Care: 2  Current functional level related to goals / functional  outcomes: See above. Did return since last visit on 12/28/2015.   Remaining deficits: See above.    Education / Equipment: HEP Plan:                                                    Patient goals were not met. Patient is being discharged due to not returning since the last visit.  Thank you for the referral. Earlie Counts, PT 03/08/16 8:36 AM  ?????

## 2016-01-04 ENCOUNTER — Encounter: Payer: Self-pay | Admitting: Physical Therapy

## 2016-01-11 ENCOUNTER — Encounter: Payer: Self-pay | Admitting: Physical Therapy

## 2017-09-30 ENCOUNTER — Encounter: Payer: Self-pay | Admitting: Physician Assistant

## 2017-09-30 ENCOUNTER — Ambulatory Visit (INDEPENDENT_AMBULATORY_CARE_PROVIDER_SITE_OTHER): Payer: 59 | Admitting: Physician Assistant

## 2017-09-30 ENCOUNTER — Other Ambulatory Visit: Payer: Self-pay

## 2017-09-30 VITALS — BP 122/74 | HR 86 | Temp 99.0°F | Resp 18 | Ht 65.75 in | Wt 155.8 lb

## 2017-09-30 DIAGNOSIS — Z0000 Encounter for general adult medical examination without abnormal findings: Secondary | ICD-10-CM

## 2017-09-30 DIAGNOSIS — Z13228 Encounter for screening for other metabolic disorders: Secondary | ICD-10-CM

## 2017-09-30 DIAGNOSIS — F418 Other specified anxiety disorders: Secondary | ICD-10-CM | POA: Diagnosis not present

## 2017-09-30 DIAGNOSIS — Z1329 Encounter for screening for other suspected endocrine disorder: Secondary | ICD-10-CM | POA: Diagnosis not present

## 2017-09-30 DIAGNOSIS — Z131 Encounter for screening for diabetes mellitus: Secondary | ICD-10-CM | POA: Diagnosis not present

## 2017-09-30 DIAGNOSIS — Z8639 Personal history of other endocrine, nutritional and metabolic disease: Secondary | ICD-10-CM | POA: Diagnosis not present

## 2017-09-30 DIAGNOSIS — F5105 Insomnia due to other mental disorder: Secondary | ICD-10-CM

## 2017-09-30 DIAGNOSIS — Z1389 Encounter for screening for other disorder: Secondary | ICD-10-CM

## 2017-09-30 DIAGNOSIS — Z13220 Encounter for screening for lipoid disorders: Secondary | ICD-10-CM

## 2017-09-30 DIAGNOSIS — Z114 Encounter for screening for human immunodeficiency virus [HIV]: Secondary | ICD-10-CM | POA: Diagnosis not present

## 2017-09-30 DIAGNOSIS — E010 Iodine-deficiency related diffuse (endemic) goiter: Secondary | ICD-10-CM

## 2017-09-30 DIAGNOSIS — Z130 Encounter for screening for diseases of the blood and blood-forming organs and certain disorders involving the immune mechanism: Secondary | ICD-10-CM

## 2017-09-30 DIAGNOSIS — R69 Illness, unspecified: Secondary | ICD-10-CM | POA: Diagnosis not present

## 2017-09-30 MED ORDER — ESCITALOPRAM OXALATE 20 MG PO TABS: 20.0000 mg | ORAL_TABLET | Freq: Every day | ORAL | 0 refills | Status: DC

## 2017-09-30 MED ORDER — TRAZODONE HCL 150 MG PO TABS: 75.0000 mg | ORAL_TABLET | Freq: Every day | ORAL | 3 refills | Status: DC

## 2017-09-30 NOTE — Patient Instructions (Signed)
     IF you received an x-ray today, you will receive an invoice from [facility]. Please contact [facility] at [phone] with questions or concerns regarding your invoice.   IF you received labwork today, you will receive an invoice from [facility]. Please contact [facility] at [phone] with questions or concerns regarding your invoice.   Our billing staff will not be able to assist you with questions regarding bills from these companies.  You will be contacted with the lab results as soon as they are available. The fastest way to get your results is to activate your My Chart account. Instructions are located on the last page of this paperwork. If you have not heard from us regarding the results in 2 weeks, please contact this office.     

## 2017-09-30 NOTE — Progress Notes (Signed)
Emma James  MRN: [id number] DOB: 10/13/1970  PCP: Mancel Bale, PA-C   Chief Complaint  Patient presents with  . Annual Exam    no pap     Subjective:  Pt presents to clinic for a CPE.  Lifetime depression and anxiety - not sure if one triggers the other - her past medications include lexapro which stopped working and then she was on Zoloft for over 10 years which then stopped working.  She was tried on Viibryd but it was to expensive so for the last several months she has been back on lexapro.  She feels like her depression is well controlled but she is still having some problems with he anxiety.  She sleeps well due to Trazodone.  She would like to have me start Rx these medications.  Worried about a known thyroid nodule - last [place] when she was a teenager - she feels like it has grown - sometimes feels like she might have trouble swallowing due to it but has not had it checked in years.   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  She has not changed her eating habits since this job change.  Last dental exam: every 6 months now that she has insurance Last vision exam: this year Last pap: 8/18 - normal Last mammo: last one 10 years ago - patient just got insurance  Vaccinations      Tetanus - thinks within the last 10 years   Typical meals for patient: 2 meals - mostly home cooked, pack lunch from home Typical beverage choices: coffee in the am, sweet tea at lunch and than water otherwise Exercises: none Sleeps: 7-8 hrs per night and sleeping well with trazodone   Patient Active Problem List   Diagnosis Date Noted  . Depression with anxiety 01/25/2015  . 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  HENT: Positive for tinnitus (both ears - all the time but only bothers her when she is in the quiet -- she has tried sudafed but that has not helped).   Eyes: Negative.   Respiratory: Negative.   Cardiovascular: Negative.   Gastrointestinal: Negative.   Endocrine: Negative.   Genitourinary: Positive for urgency (stress incontinence - does not do kegals).  Musculoskeletal: Negative.   Skin: Negative.   Allergic/Immunologic: Negative.   Neurological: Negative.   Hematological: Negative.   Psychiatric/Behavioral: Negative.      No current outpatient medications on file prior to visit.   No current facility-administered medications on file prior to visit.     Allergies  Allergen Reactions  . Codeine Nausea And Vomiting  . Latex   . Sulfa Antibiotics Other (See Comments)    Hallucinations,   . Penicillins Diarrhea and Rash    Social History   Socioeconomic History  . Marital status: Legally Separated    Spouse name: Not on file  . Number of children: 1  . Years of education: Not on file  . Highest education level: Not on file  Occupational History  . Not on file  Social Needs  . Financial resource strain: Not on file  . Food insecurity:    Worry: Not on file  Inability: Not on file  . Transportation needs:    Medical: Not on file    Non-medical: Not on file  Tobacco Use  . Smoking status: Former Smoker    Packs/day: 1.00    Years: 30.00    Pack years: 30.00    Types: Cigarettes    Last attempt to quit: 11/13/2015    Years since quitting: 1.8  . Smokeless tobacco: Never Used  Substance and Sexual Activity  . Alcohol use: No    Alcohol/week: 0.0 oz  . Drug use: Never  . Sexual activity: Not Currently  Lifestyle  . Physical activity:    Days per week: Not on file    Minutes per session: Not on file  . Stress: Not on file  Relationships  . Social connections:    Talks on phone: Not on  file    Gets together: Not on file    Attends religious service: Not on file    Active member of club or organization: Not on file    Attends meetings of clubs or organizations: Not on file    Relationship status: Not on file  Other Topics Concern  . Not on file  Social History Narrative   Lives alone   Works for [employer] - 47 y/o   [place] - 1    Past Surgical History:  Procedure Laterality Date  . TOTAL ABDOMINAL HYSTERECTOMY  11/14/2015    Family History  Problem Relation Age of Onset  . Heart attack Mother   . Melanoma Father   . Multiple sclerosis Father   . Diabetes Father      Objective:  BP 122/74   Pulse 86   Temp 99 F (37.2 C) (Oral)   Resp 18   Ht 5' 5.75" (1.67 m)   Wt 155 lb 12.8 oz (70.7 kg)   LMP 09/06/2015 (Approximate)   SpO2 97%   BMI 25.34 kg/m   Physical Exam  Constitutional: She is oriented to person, place, and time. She appears well-developed and well-nourished.  HENT:  Head: Normocephalic and atraumatic.  Right Ear: Hearing, external ear and ear canal normal. A middle ear effusion (serous ) is present.  Left Ear: Hearing, external ear and ear canal normal. A middle ear effusion (serous) is present.  Nose: Nose normal.  Mouth/Throat: Uvula is midline, oropharynx is clear and moist and mucous membranes are normal.  Eyes: Pupils are equal, round, and reactive to light. Conjunctivae, EOM and lids are normal. Right eye exhibits no discharge. Left eye exhibits no discharge.  Neck: Trachea normal and normal range of motion. Neck supple. Thyromegaly (full - no nodule palpated) present. No thyroid mass present.  Cardiovascular: Normal rate, regular rhythm and normal heart sounds.  No murmur heard. Pulmonary/Chest: Effort normal and breath sounds normal. She has no wheezes.  Abdominal: Soft. Normal appearance and bowel sounds are normal. There is no tenderness.  Musculoskeletal: Normal range of motion.  Lymphadenopathy:        Head (right side): No tonsillar, no preauricular, no posterior auricular and no occipital adenopathy present.       Head (left side): No tonsillar, no preauricular, no posterior auricular and no occipital adenopathy present.    She has no cervical adenopathy.       Right: No supraclavicular adenopathy present.       Left: No supraclavicular adenopathy present.  Neurological: She is alert and oriented to person, place, and time. She has normal strength and normal  reflexes.  Skin: Skin is warm, dry and intact.  Psychiatric: She has a normal mood and affect. Her speech is normal and behavior is normal. Judgment and thought content normal.  Vitals reviewed.   Wt Readings from Last 3 Encounters:  09/30/17 155 lb 12.8 oz (70.7 kg)  09/20/15 144 lb (65.3 kg)  01/25/15 140 lb 3.2 oz (63.6 kg)     Visual Acuity Screening   Right eye Left eye Both eyes  Without correction:     With correction: _0    Assessment and Plan :  Annual physical exam -anticipatory guidance given encourage patient to have more physical activity during the day especially since she now has a more sedentary job  Insomnia secondary to depression with anxiety - Plan: traZODone (DESYREL) 150 MG tablet -  -working well refilled for patient  Depression with anxiety - Plan: escitalopram (LEXAPRO) 20 MG tablet -patient still has residual anxiety which will likely happen at a lower doses of medication we will increase Lexapro to 20 mg daily recheck in 6 weeks  Screening for deficiency anemia - Plan: CBC with Differential/Platelet  Screening for metabolic disorder - Plan: CMP14+EGFR  Screening for diabetes mellitus - Plan: Hemoglobin A1c  Screening, lipid - Plan: Lipid panel  Screening for thyroid disorder - Plan: TSH -check labs  Screening for HIV (human immunodeficiency virus) - Plan: HIV antibody  Screening for blood or protein in urine - Plan: Urinalysis, dipstick only  History of thyroid nodule -  Plan: US Soft Tissue Head/Neck -we will start with ultrasound for full thyroid and depending on results of the ultrasound order further testing also will await lab results  Thyromegaly  Patient verbalized to me that they understand the following: diagnosis, what is being done for them, what to expect and what should be done at home.  Their questions have been answered.  See after visit summary for patient specific instructions.   Windell Hummingbird PA-C  [facility] at [facility] 09/30/2017 12:07 PM  Please note: Portions of this report may have been transcribed using dragon voice recognition software. Every effort was made to ensure accuracy; however, inadvertent computerized transcription errors may be present.

## 2017-10-01 LAB — URINALYSIS, DIPSTICK ONLY
Bilirubin, UA: NEGATIVE
Glucose, UA: NEGATIVE
KETONES UA: NEGATIVE
Leukocytes, UA: NEGATIVE
NITRITE UA: NEGATIVE
PROTEIN UA: NEGATIVE
RBC UA: NEGATIVE
Urobilinogen, Ur: 0.2 mg/dL (ref 0.2–1.0)
pH, UA: 7.5 (ref 5.0–7.5)

## 2017-10-01 LAB — CBC WITH DIFFERENTIAL/PLATELET
BASOS: 0 %
Basophils Absolute: 0 10*3/uL (ref 0.0–0.2)
EOS (ABSOLUTE): 0.2 10*3/uL (ref 0.0–0.4)
EOS: 3 %
HEMATOCRIT: 42.6 % (ref 34.0–46.6)
Hemoglobin: 13.5 g/dL (ref 11.1–15.9)
IMMATURE GRANS (ABS): 0 10*3/uL (ref 0.0–0.1)
IMMATURE GRANULOCYTES: 0 %
LYMPHS: 28 %
Lymphocytes Absolute: 1.6 10*3/uL (ref 0.7–3.1)
MCH: 28 pg (ref 26.6–33.0)
MCHC: 31.7 g/dL (ref 31.5–35.7)
MCV: 88 fL (ref 79–97)
Monocytes Absolute: 0.4 10*3/uL (ref 0.1–0.9)
Monocytes: 6 %
NEUTROS PCT: 63 %
Neutrophils Absolute: 3.6 10*3/uL (ref 1.4–7.0)
Platelets: 248 10*3/uL (ref 150–450)
RBC: 4.83 x10E6/uL (ref 3.77–5.28)
RDW: 13.3 % (ref 12.3–15.4)
WBC: 5.7 10*3/uL (ref 3.4–10.8)

## 2017-10-01 LAB — CMP14+EGFR
A/G RATIO: 2.1 (ref 1.2–2.2)
ALT: 27 IU/L (ref 0–32)
AST: 20 IU/L (ref 0–40)
Albumin: 4.6 g/dL (ref 3.5–5.5)
Alkaline Phosphatase: 68 IU/L (ref 39–117)
BUN/Creatinine Ratio: 17 (ref 9–23)
BUN: 10 mg/dL (ref 6–24)
Bilirubin Total: 0.3 mg/dL (ref 0.0–1.2)
CALCIUM: 9.4 mg/dL (ref 8.7–10.2)
CO2: 23 mmol/L (ref 20–29)
CREATININE: 0.59 mg/dL (ref 0.57–1.00)
Chloride: 101 mmol/L (ref 96–106)
GFR, EST AFRICAN AMERICAN: 129 mL/min/{1.73_m2} (ref >59)
GFR, EST NON AFRICAN AMERICAN: 112 mL/min/{1.73_m2} (ref >59)
Globulin, Total: 2.2 g/dL (ref 1.5–4.5)
Glucose: 89 mg/dL (ref 65–99)
POTASSIUM: 4.5 mmol/L (ref 3.5–5.2)
Sodium: 139 mmol/L (ref 134–144)
TOTAL PROTEIN: 6.8 g/dL (ref 6.0–8.5)

## 2017-10-01 LAB — HIV ANTIBODY (ROUTINE TESTING W REFLEX): HIV SCREEN 4TH GENERATION: NONREACTIVE

## 2017-10-01 LAB — LIPID PANEL
CHOL/HDL RATIO: 5.4 ratio — AB (ref 0.0–4.4)
Cholesterol, Total: 211 mg/dL — ABNORMAL HIGH (ref 100–199)
HDL: 39 mg/dL — ABNORMAL LOW (ref >39)
LDL CALC: 138 mg/dL — AB (ref 0–99)
Triglycerides: 172 mg/dL — ABNORMAL HIGH (ref 0–149)
VLDL Cholesterol Cal: 34 mg/dL (ref 5–40)

## 2017-10-01 LAB — HEMOGLOBIN A1C
Est. average glucose Bld gHb Est-mCnc: 105 mg/dL
HEMOGLOBIN A1C: 5.3 % (ref 4.8–5.6)

## 2017-10-01 LAB — HM HIV SCREENING LAB: HM HIV Screening: NEGATIVE

## 2017-10-01 LAB — TSH: TSH: 1.13 u[IU]/mL (ref 0.450–4.500)

## 2017-10-14 ENCOUNTER — Ambulatory Visit
Admission: RE | Admit: 2017-10-14 | Discharge: 2017-10-14 | Disposition: A | Discharge status: Home / Self Care | Payer: Self-pay | Source: Ambulatory Visit | Attending: Physician Assistant | Admitting: Physician Assistant

## 2017-10-14 DIAGNOSIS — E042 Nontoxic multinodular goiter: Secondary | ICD-10-CM | POA: Diagnosis not present

## 2017-10-14 DIAGNOSIS — Z8639 Personal history of other endocrine, nutritional and metabolic disease: Secondary | ICD-10-CM

## 2017-10-17 ENCOUNTER — Other Ambulatory Visit: Payer: Self-pay | Admitting: Physician Assistant

## 2017-10-17 DIAGNOSIS — E042 Nontoxic multinodular goiter: Secondary | ICD-10-CM

## 2017-10-17 HISTORY — DX: Nontoxic multinodular goiter: E04.2

## 2017-10-17 NOTE — Progress Notes (Signed)
Thyroid u/s

## 2017-10-24 ENCOUNTER — Telehealth: Payer: Self-pay | Admitting: Physician Assistant

## 2017-10-24 DIAGNOSIS — E042 Nontoxic multinodular goiter: Secondary | ICD-10-CM

## 2017-10-24 NOTE — Telephone Encounter (Signed)
Copied from CRM [id number]. Topic: Inquiry >> Oct 24, 2017 11:09 AM Stephannie LiSimmons, Deisy Ozbun L, NT wrote: Reason for CRM: Patient called and said she has had a ultrasound  already and  want to know if this is a duplicate and so does [facility] , please advise [phone]

## 2017-10-29 NOTE — Telephone Encounter (Signed)
Fixed the US thyroid order for in 1 year.

## 2017-10-29 NOTE — Telephone Encounter (Signed)
Please enter correct one

## 2017-11-12 ENCOUNTER — Ambulatory Visit: Payer: 59 | Admitting: Physician Assistant

## 2017-11-14 ENCOUNTER — Encounter: Payer: Self-pay | Admitting: Physician Assistant

## 2017-11-14 ENCOUNTER — Other Ambulatory Visit: Payer: Self-pay

## 2017-11-14 ENCOUNTER — Ambulatory Visit (INDEPENDENT_AMBULATORY_CARE_PROVIDER_SITE_OTHER): Payer: 59 | Admitting: Physician Assistant

## 2017-11-14 DIAGNOSIS — F418 Other specified anxiety disorders: Secondary | ICD-10-CM

## 2017-11-14 DIAGNOSIS — R69 Illness, unspecified: Secondary | ICD-10-CM | POA: Diagnosis not present

## 2017-11-14 MED ORDER — ESCITALOPRAM OXALATE 20 MG PO TABS: 20.0000 mg | ORAL_TABLET | Freq: Every day | ORAL | 5 refills | Status: DC

## 2017-11-14 NOTE — Patient Instructions (Addendum)
[facility] - [address], [place], [place] [address] Phone: [phone]  Leretha PolSantiago or Creta LevinStallings - for f/u here at [place]    IF you received an x-ray today, you will receive an invoice from [facility]. Please contact [facility] at [phone] with questions or concerns regarding your invoice.   IF you received labwork today, you will receive an invoice from [facility]. Please contact [facility] at [phone] with questions or concerns regarding your invoice.   Our billing staff will not be able to assist you with questions regarding bills from these companies.  You will be contacted with the lab results as soon as they are available. The fastest way to get your results is to activate your My Chart account. Instructions are located on the last page of this paperwork. If you have not heard from us regarding the results in 2 weeks, please contact this office.

## 2017-11-14 NOTE — Progress Notes (Signed)
Emma James  MRN: [id number] DOB: 1970-07-18  PCP: Morrell Riddle, PA-C  Chief Complaint  Patient presents with  . Depression    screening done  . Anxiety    screening done  . Follow-up    lexapro    Subjective:  Pt presents to clinic for recheck of her anxiety and depression since increasing her dose of Lexapro to 20 mg daily.  Patient feels much better and is glad that we did that change.  Depression and anxiety is much better controlled with the increase dose of medication.  Sleep is good.   History is obtained by patient.  Review of Systems  Psychiatric/Behavioral: Positive for dysphoric mood. The patient is nervous/anxious.     Patient Active Problem List   Diagnosis Date Noted  . Multinodular thyroid 10/17/2017  . Depression with anxiety 01/25/2015  . Insomnia secondary to depression with anxiety 01/25/2015    Current Outpatient Medications on File Prior to Visit  Medication Sig Dispense Refill  . traZODone (DESYREL) 150 MG tablet Take 0.5-1 tablets (75-150 mg total) by mouth at bedtime. 90 tablet 3   No current facility-administered medications on file prior to visit.     Allergies  Allergen Reactions  . Codeine Nausea And Vomiting  . Latex   . Sulfa Antibiotics Other (See Comments)    Hallucinations,   . Penicillins Diarrhea and Rash    Past Medical History:  Diagnosis Date  . Anxiety and depression    Social History   Social History Narrative   Lives alone   Works for [employer]   Children - 3 y/o   Grandchild - 1   Social History   Tobacco Use  . Smoking status: Former Smoker    Packs/day: 1.00    Years: 30.00    Pack years: 30.00    Types: Cigarettes    Last attempt to quit: 11/13/2015    Years since quitting: 2.0  . Smokeless tobacco: Never Used  Substance Use Topics  . Alcohol use: No    Alcohol/week: 0.0 standard drinks  . Drug use: Never   family history includes Diabetes in her father; Heart attack in her  mother; Melanoma in her father; Multiple sclerosis in her father.     Objective:  BP 122/82   Pulse 77   Temp 98.8 F (37.1 C) (Oral)   Resp 20   Ht 5' 5.43" (1.662 m)   Wt 153 lb 3.2 oz (69.5 kg)   LMP 09/06/2015 (Approximate)   SpO2 96%   BMI 25.16 kg/m  Body mass index is 25.16 kg/m.  Wt Readings from Last 3 Encounters:  11/14/17 153 lb 3.2 oz (69.5 kg)  09/30/17 155 lb 12.8 oz (70.7 kg)  09/20/15 144 lb (65.3 kg)    Physical Exam  Constitutional: She is oriented to person, place, and time. She appears well-developed and well-nourished.  HENT:  Head: Normocephalic and atraumatic.  Right Ear: Hearing and external ear normal.  Left Ear: Hearing and external ear normal.  Eyes: Conjunctivae are normal.  Neck: Normal range of motion.  Pulmonary/Chest: Effort normal.  Neurological: She is alert and oriented to person, place, and time.  Skin: Skin is warm, dry and intact.  Psychiatric: She has a normal mood and affect. Her behavior is normal. Judgment and thought content normal.  Vitals reviewed.   Assessment and Plan :  Depression with anxiety - Plan: escitalopram (LEXAPRO) 20 MG tablet -continue with medication as this is working well for  patient.  We did discuss that she may have periods of time where either her depression or anxiety is worse but we want to look at the overall picture in regards to medication and its effectiveness.  Patient is doing well we will recheck in 6 months.  Patient verbalized to me that they understand the following: diagnosis, what is being done for them, what to expect and what should be done at home.  Their questions have been answered.  See after visit summary for patient specific instructions.  Benny LennertSarah Weber PA-C  [facility] 11/14/2017 10:24 AM  Please note: Portions of this report may have been transcribed using dragon voice recognition software. Every effort was made to ensure accuracy; however,  inadvertent computerized transcription errors may be present.

## 2017-12-12 DIAGNOSIS — Z713 Dietary counseling and surveillance: Secondary | ICD-10-CM | POA: Diagnosis not present

## 2017-12-12 DIAGNOSIS — E663 Overweight: Secondary | ICD-10-CM | POA: Diagnosis not present

## 2018-01-22 DIAGNOSIS — Z1231 Encounter for screening mammogram for malignant neoplasm of breast: Secondary | ICD-10-CM | POA: Diagnosis not present

## 2018-02-06 DIAGNOSIS — N6002 Solitary cyst of left breast: Secondary | ICD-10-CM | POA: Diagnosis not present

## 2018-02-06 DIAGNOSIS — R928 Other abnormal and inconclusive findings on diagnostic imaging of breast: Secondary | ICD-10-CM | POA: Diagnosis not present

## 2018-02-11 DIAGNOSIS — Z803 Family history of malignant neoplasm of breast: Secondary | ICD-10-CM | POA: Diagnosis not present

## 2018-02-11 DIAGNOSIS — Z808 Family history of malignant neoplasm of other organs or systems: Secondary | ICD-10-CM | POA: Diagnosis not present

## 2018-02-11 DIAGNOSIS — Z01419 Encounter for gynecological examination (general) (routine) without abnormal findings: Secondary | ICD-10-CM | POA: Diagnosis not present

## 2018-02-11 DIAGNOSIS — Z801 Family history of malignant neoplasm of trachea, bronchus and lung: Secondary | ICD-10-CM | POA: Diagnosis not present

## 2018-02-11 DIAGNOSIS — Z800 Family history of malignant neoplasm of digestive organs: Secondary | ICD-10-CM | POA: Diagnosis not present

## 2018-02-11 DIAGNOSIS — Z6825 Body mass index (BMI) 25.0-25.9, adult: Secondary | ICD-10-CM | POA: Diagnosis not present

## 2018-02-11 DIAGNOSIS — Z1212 Encounter for screening for malignant neoplasm of rectum: Secondary | ICD-10-CM | POA: Diagnosis not present

## 2018-03-17 DIAGNOSIS — J209 Acute bronchitis, unspecified: Secondary | ICD-10-CM | POA: Diagnosis not present

## 2018-05-09 DIAGNOSIS — Z809 Family history of malignant neoplasm, unspecified: Secondary | ICD-10-CM | POA: Diagnosis not present

## 2019-01-15 ENCOUNTER — Other Ambulatory Visit: Payer: Self-pay

## 2019-01-15 ENCOUNTER — Emergency Department (HOSPITAL_COMMUNITY): Payer: 59

## 2019-01-15 ENCOUNTER — Encounter (HOSPITAL_COMMUNITY): Payer: Self-pay | Admitting: Emergency Medicine

## 2019-01-15 ENCOUNTER — Encounter: Payer: Self-pay | Admitting: Adult Health Nurse Practitioner

## 2019-01-15 ENCOUNTER — Ambulatory Visit: Payer: 59 | Admitting: Adult Health Nurse Practitioner

## 2019-01-15 ENCOUNTER — Emergency Department (HOSPITAL_COMMUNITY)
Admission: EM | Admit: 2019-01-15 | Discharge: 2019-01-15 | Disposition: A | Discharge status: Home / Self Care | Payer: 59 | Attending: Emergency Medicine | Admitting: Emergency Medicine

## 2019-01-15 VITALS — BP 146/86 | HR 111 | Temp 98.4°F | Resp 16 | Ht 64.88 in | Wt 146.0 lb

## 2019-01-15 DIAGNOSIS — R0789 Other chest pain: Secondary | ICD-10-CM | POA: Diagnosis present

## 2019-01-15 DIAGNOSIS — R200 Anesthesia of skin: Secondary | ICD-10-CM | POA: Diagnosis not present

## 2019-01-15 DIAGNOSIS — Z79899 Other long term (current) drug therapy: Secondary | ICD-10-CM | POA: Diagnosis not present

## 2019-01-15 DIAGNOSIS — R410 Disorientation, unspecified: Secondary | ICD-10-CM | POA: Diagnosis not present

## 2019-01-15 DIAGNOSIS — F17210 Nicotine dependence, cigarettes, uncomplicated: Secondary | ICD-10-CM | POA: Diagnosis not present

## 2019-01-15 DIAGNOSIS — R42 Dizziness and giddiness: Secondary | ICD-10-CM | POA: Diagnosis not present

## 2019-01-15 DIAGNOSIS — R202 Paresthesia of skin: Secondary | ICD-10-CM | POA: Diagnosis not present

## 2019-01-15 DIAGNOSIS — I10 Essential (primary) hypertension: Secondary | ICD-10-CM | POA: Diagnosis not present

## 2019-01-15 DIAGNOSIS — G4489 Other headache syndrome: Secondary | ICD-10-CM | POA: Diagnosis not present

## 2019-01-15 DIAGNOSIS — R270 Ataxia, unspecified: Secondary | ICD-10-CM | POA: Diagnosis not present

## 2019-01-15 DIAGNOSIS — R002 Palpitations: Secondary | ICD-10-CM

## 2019-01-15 DIAGNOSIS — R079 Chest pain, unspecified: Secondary | ICD-10-CM | POA: Diagnosis not present

## 2019-01-15 DIAGNOSIS — R9431 Abnormal electrocardiogram [ECG] [EKG]: Secondary | ICD-10-CM

## 2019-01-15 DIAGNOSIS — I160 Hypertensive urgency: Secondary | ICD-10-CM

## 2019-01-15 DIAGNOSIS — Z91040 Latex allergy status: Secondary | ICD-10-CM | POA: Insufficient documentation

## 2019-01-15 DIAGNOSIS — R110 Nausea: Secondary | ICD-10-CM | POA: Diagnosis not present

## 2019-01-15 DIAGNOSIS — I200 Unstable angina: Secondary | ICD-10-CM

## 2019-01-15 DIAGNOSIS — R69 Illness, unspecified: Secondary | ICD-10-CM | POA: Diagnosis not present

## 2019-01-15 DIAGNOSIS — F419 Anxiety disorder, unspecified: Secondary | ICD-10-CM | POA: Diagnosis not present

## 2019-01-15 DIAGNOSIS — R519 Headache, unspecified: Secondary | ICD-10-CM | POA: Diagnosis not present

## 2019-01-15 HISTORY — DX: Hypertensive urgency: I16.0

## 2019-01-15 HISTORY — DX: Abnormal electrocardiogram (ECG) (EKG): R94.31

## 2019-01-15 HISTORY — DX: Chest pain, unspecified: R07.9

## 2019-01-15 LAB — BASIC METABOLIC PANEL
Anion gap: 9 (ref 5–15)
BUN: 8 mg/dL (ref 6–20)
CO2: 26 mmol/L (ref 22–32)
Calcium: 9.2 mg/dL (ref 8.9–10.3)
Chloride: 103 mmol/L (ref 98–111)
Creatinine, Ser: 0.67 mg/dL (ref 0.44–1.00)
GFR calc Af Amer: >60 mL/min (ref >60)
GFR calc non Af Amer: >60 mL/min (ref >60)
Glucose, Bld: 99 mg/dL (ref 70–99)
Potassium: 3.7 mmol/L (ref 3.5–5.1)
Sodium: 138 mmol/L (ref 135–145)

## 2019-01-15 LAB — I-STAT BETA HCG BLOOD, ED (MC, WL, AP ONLY): I-stat hCG, quantitative: <5 m[IU]/mL (ref <5)

## 2019-01-15 LAB — CBC
HCT: 45.8 % (ref 36.0–46.0)
Hemoglobin: 14.7 g/dL (ref 12.0–15.0)
MCH: 28.8 pg (ref 26.0–34.0)
MCHC: 32.1 g/dL (ref 30.0–36.0)
MCV: 89.8 fL (ref 80.0–100.0)
Platelets: 263 10*3/uL (ref 150–400)
RBC: 5.1 MIL/uL (ref 3.87–5.11)
RDW: 12.5 % (ref 11.5–15.5)
WBC: 8.6 10*3/uL (ref 4.0–10.5)
nRBC: 0 % (ref 0.0–0.2)

## 2019-01-15 LAB — TROPONIN I (HIGH SENSITIVITY)
Troponin I (High Sensitivity): <2 ng/L (ref <18)
Troponin I (High Sensitivity): <2 ng/L (ref <18)

## 2019-01-15 LAB — URINALYSIS, ROUTINE W REFLEX MICROSCOPIC
Bilirubin Urine: NEGATIVE
Glucose, UA: NEGATIVE mg/dL
Hgb urine dipstick: NEGATIVE
Ketones, ur: NEGATIVE mg/dL
Leukocytes,Ua: NEGATIVE
Nitrite: NEGATIVE
Protein, ur: NEGATIVE mg/dL
Specific Gravity, Urine: 1.008 (ref 1.005–1.030)
pH: 9 — ABNORMAL HIGH (ref 5.0–8.0)

## 2019-01-15 LAB — D-DIMER, QUANTITATIVE: D-Dimer, Quant: <0.27 ug/mL-FEU (ref 0.00–0.50)

## 2019-01-15 MED ORDER — ACETAMINOPHEN 500 MG PO TABS
1000.0000 mg | ORAL_TABLET | Freq: Once | ORAL | Status: AC
  Administered 2019-01-15: 1000 mg via ORAL
  Filled 2019-01-15: qty 2

## 2019-01-15 MED ORDER — ASPIRIN 81 MG PO CHEW
324.0000 mg | CHEWABLE_TABLET | Freq: Once | ORAL | Status: AC
  Administered 2019-01-15: 14:00:00 324 mg via ORAL

## 2019-01-15 MED ORDER — IOHEXOL 350 MG/ML SOLN
75.0000 mL | Freq: Once | INTRAVENOUS | Status: AC | PRN
  Administered 2019-01-15: 75 mL via INTRAVENOUS

## 2019-01-15 MED ORDER — LORAZEPAM 1 MG PO TABS
2.0000 mg | ORAL_TABLET | Freq: Once | ORAL | Status: AC
  Administered 2019-01-15: 2 mg via ORAL
  Filled 2019-01-15: qty 2

## 2019-01-15 MED ORDER — NITROGLYCERIN 0.4 MG SL SUBL
0.4000 mg | SUBLINGUAL_TABLET | SUBLINGUAL | Status: DC | PRN
  Filled 2019-01-15: qty 1

## 2019-01-15 MED ORDER — DIPHENHYDRAMINE HCL 50 MG/ML IJ SOLN
25.0000 mg | Freq: Once | INTRAMUSCULAR | Status: AC
  Administered 2019-01-15: 25 mg via INTRAVENOUS
  Filled 2019-01-15: qty 1

## 2019-01-15 MED ORDER — METOCLOPRAMIDE HCL 5 MG/ML IJ SOLN
10.0000 mg | Freq: Once | INTRAMUSCULAR | Status: AC
  Administered 2019-01-15: 10 mg via INTRAVENOUS
  Filled 2019-01-15: qty 2

## 2019-01-15 MED ORDER — SODIUM CHLORIDE 0.9% FLUSH
3.0000 mL | Freq: Once | INTRAVENOUS | Status: AC
  Administered 2019-01-15: 3 mL via INTRAVENOUS

## 2019-01-15 NOTE — ED Provider Notes (Signed)
[facility] EMERGENCY DEPARTMENT Provider Note   CSN: [id number] Arrival date & time: 01/15/19  1419     History   Chief Complaint Chief Complaint  Patient presents with  . Stroke Symptoms    HPI Emma James is a 48 y.o. female.  HPI: A 48 year old patient presents for evaluation of chest pain. Initial onset of pain was approximately 1-3 hours ago. The patient's chest pain is described as heaviness/pressure/tightness, is sharp and is not worse with exertion. The patient complains of nausea. The patient's chest pain is middle- or left-sided, is not well-localized and does not radiate to the arms/jaw/neck. The patient denies diaphoresis. The patient has smoked in the past 90 days. The patient has no history of stroke, has no history of peripheral artery disease, denies any history of treated diabetes, has no relevant family history of coronary artery disease (first degree relative at less than age 108), is not hypertensive, has no history of hypercholesterolemia and does not have an elevated BMI (>=30).   HPI  The patient states that she went to her PCPs office because her blood pressure was high. She was then told that she needed to go to the hospital.  She states that around 24 hours ago, she started feeling dizzy, confused, and as if she was drunk. She feels very unsteady on her feet and "swimmy headed" with an associated mild headache. She states that her symptoms still persist in the ED. Last known normal two days ago. She states that she has had decreased sensation along her right side since 10am yesterday. She denies weakness. She endorses some nausea. She denies a history of HTN. Her initial vitals were BP 170/110, P 100, sats 100% on room air. Her CBG was 133.   She describes a tension type headache in a band like location across her forehead, 4/10 in severity for the past two days.  She additionally endorses left sided chest pain, as if "someone is squeezing me  right here in my heart." Symptoms started 1.5 hours PTA to the ED. She endorses radiation of pain to her right arm with associated tingling and numbness. She feels short of breath. She was given 324mg  ASA at her PCP's office. Her EKG at the PCP office revealed a tachycardic rate to 102, nonspecific ST depression without ST elevations.   Past Medical History:  Diagnosis Date  . Anxiety and depression   . Chest pain 01/15/2019  . EKG, abnormal 01/15/2019  . Hypertensive urgency 01/15/2019    Patient Active Problem List   Diagnosis Date Noted  . Chest pain 01/15/2019  . EKG, abnormal 01/15/2019  . Hypertensive urgency 01/15/2019  . Multinodular thyroid 10/17/2017  . Depression with anxiety 01/25/2015  . 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 TOTAL ABDOMINAL HYSTERECTOMY  11/14/2015     OB History   No obstetric history on file.      Home Medications    Prior to Admission medications   Medication Sig Start Date End Date Taking? Authorizing Provider  escitalopram (LEXAPRO) 20 MG tablet Take 20 mg by mouth daily.   Yes [provider]  Multiple Vitamins-Minerals (WOMENS 50+ MULTI VITAMIN/MIN PO) Take 1 tablet by mouth daily.   Yes [provider]  traZODone (DESYREL) 150 MG tablet Take 0.5-1 tablets (75-150 mg total) by mouth at bedtime. 09/30/17  Yes Weber, 10/02/17, PA-C    Family History Family History  Problem  Relation Age of Onset  . Heart attack Mother   . Melanoma Father   . Multiple sclerosis Father   . Diabetes Father     Social History Social History   Tobacco Use  . Smoking status: Current Every Day Smoker    Packs/day: 1.00    Types: Cigarettes  . Smokeless tobacco: Never Used  Substance Use Topics  . Alcohol use: Yes    Alcohol/week: 0.0 standard drinks    Comment: occ  . Drug use: Never     Allergies   Codeine, Latex, Sulfa antibiotics, and Penicillins    Review of Systems Review of Systems  Constitutional: Negative for chills and fever.  Respiratory: Positive for shortness of breath. Negative for cough.   Cardiovascular: Positive for chest pain and palpitations. Negative for leg swelling.  Gastrointestinal: Positive for nausea. Negative for abdominal pain and vomiting.  Genitourinary: Negative for dysuria.  Musculoskeletal: Negative for neck pain.  Skin: Negative for rash.  Neurological: Positive for numbness and headaches. Negative for syncope, facial asymmetry, speech difficulty and weakness.  All other systems reviewed and are negative.    Physical Exam Updated Vital Signs BP 123/76   Pulse 64   Temp 98.7 F (37.1 C) (Oral)   Resp 18   Ht 5\' 4"  (1.626 m)   Wt 66.7 kg   LMP 09/06/2015 (Approximate)   SpO2 96%   BMI 25.23 kg/m   Physical Exam Vitals signs and nursing note reviewed.  Constitutional:      General: She is not in acute distress.    Appearance: She is well-developed.  HENT:     Head: Normocephalic and atraumatic.     Right Ear: External ear normal.     Left Ear: External ear normal.     Nose: Nose normal.     Mouth/Throat:     Mouth: Mucous membranes are moist.  Eyes:     Conjunctiva/sclera: Conjunctivae normal.  Neck:     Musculoskeletal: Neck supple.  Cardiovascular:     Rate and Rhythm: Normal rate and regular rhythm.     Pulses: Normal pulses.     Heart sounds: No murmur.  Pulmonary:     Effort: Pulmonary effort is normal. No respiratory distress.     Breath sounds: Normal breath sounds.  Abdominal:     Palpations: Abdomen is soft.     Tenderness: There is no abdominal tenderness.  Skin:    General: Skin is warm and dry.  Neurological:     Mental Status: She is alert.     Comments: CN II-XII grossly intact. 5/5 strength noted all four extremities. Decreased sensation in the RUE compared to the left. Gait mildly ataxic.      ED Treatments / Results  Labs (all labs ordered are listed,  but only abnormal results are displayed) Labs Reviewed  URINALYSIS, ROUTINE W REFLEX MICROSCOPIC - Abnormal; Notable for the following components:      Result Value   Color, Urine STRAW (*)    pH 9.0 (*)    All other components within normal limits  BASIC METABOLIC PANEL  CBC  D-DIMER, QUANTITATIVE (NOT AT [facility])  CBG MONITORING, ED  I-STAT BETA HCG BLOOD, ED ([facility], [facility], [facility] ONLY)  TROPONIN I (HIGH SENSITIVITY)  TROPONIN I (HIGH SENSITIVITY)    EKG EKG Interpretation  Date/Time:  Thursday January 15 2019 14:25:26 EDT Ventricular Rate:  89 PR Interval:    QRS Duration: 79 QT Interval:  342 QTC Calculation: 417 R Axis:  68 Text Interpretation:  Sinus rhythm Borderline repolarization abnormality T wave abnormality Baseline wander Abnormal ECG Confirmed by Gerhard Munch [phone]) on 01/15/2019 3:19:24 PM   Radiology Ct Angio Head W Or Wo Contrast  Result Date: 01/15/2019 CLINICAL DATA:  Dizziness and right-sided numbness. EXAM: CT ANGIOGRAPHY HEAD TECHNIQUE: Multidetector CT imaging of the head was performed using the standard protocol during bolus administration of intravenous contrast. Multiplanar CT image reconstructions and MIPs were obtained to evaluate the vascular anatomy. CONTRAST:  75mL OMNIPAQUE IOHEXOL 350 MG/ML SOLN COMPARISON:  None. FINDINGS: CT HEAD Brain: There is no mass, hemorrhage or extra-axial collection. The size and configuration of the ventricles and extra-axial CSF spaces are normal. The brain parenchyma is normal, without acute or chronic infarction. Vascular: No abnormal hyperdensity of the major intracranial arteries or dural venous sinuses. No intracranial atherosclerosis. Skull: The visualized skull base, calvarium and extracranial soft tissues are normal. Sinuses/Orbits: No fluid levels or advanced mucosal thickening of the visualized paranasal sinuses. No mastoid or middle ear effusion. The orbits are normal. CTA HEAD POSTERIOR CIRCULATION: --Vertebral arteries:  Normal codominant configuration of V4 segments. --Posterior inferior cerebellar arteries (PICA): Bilateral shared origins of the PICA and AICA. --Anterior inferior cerebellar arteries (AICA): Shared origins as above. --Basilar artery: Normal. --Superior cerebellar arteries: Normal. --Posterior cerebral arteries: Normal. Both originate from the basilar artery. Posterior communicating arteries (p-comm) are diminutive or absent. ANTERIOR CIRCULATION: --Intracranial internal carotid arteries: Normal. --Anterior cerebral arteries (ACA): Normal. Both A1 segments are present. Patent anterior communicating artery (a-comm). --Middle cerebral arteries (MCA): Normal. Venous sinuses: Patent Anatomic variants: None IMPRESSION: Normal CTA of the head. Electronically Signed   By: Deatra Robinson M.D.   On: 01/15/2019 19:34   Dg Chest 2 View  Result Date: 01/15/2019 CLINICAL DATA:  Chest pain EXAM: CHEST - 2 VIEW COMPARISON:  None FINDINGS: Normal heart size, mediastinal contours, and pulmonary vascularity. Lungs clear. No pleural effusion or pneumothorax. Bones unremarkable. IMPRESSION: Normal exam. Electronically Signed   By: Ulyses Southward M.D.   On: 01/15/2019 15:28   Mr Brain Wo Contrast  Result Date: 01/15/2019 CLINICAL DATA:  Ataxia, stroke suspected. Additional history provided: Elevated blood pressure, tingling and numbness in right arm, nausea, chest pain EXAM: MRI HEAD WITHOUT CONTRAST TECHNIQUE: Multiplanar, multiecho pulse sequences of the brain and surrounding structures were obtained without intravenous contrast. COMPARISON:  CT angiogram head 01/15/2019. FINDINGS: Brain: There is no convincing evidence of acute infarct. No evidence of intracranial mass. No midline shift or extra-axial fluid collection. No chronic intracranial blood products. Probable asymmetrically prominent mineralization within the left basal ganglia. There are few small, nonspecific scattered T2/FLAIR hyperintense foci within the bilateral  cerebral white matter. Cerebral volume is normal for age. Vascular: Flow voids maintained within the proximal large arterial vessels. Skull and upper cervical spine: No focal marrow lesion. Sinuses/Orbits: Visualized orbits demonstrate no acute abnormality. Minimal ethmoid sinus mucosal thickening. Small left maxillary sinus mucous retention cyst. No significant mastoid effusion. IMPRESSION: 1. No evidence of acute intracranial abnormality, including acute infarction. 2. A few small scattered foci of T2 hyperintensity within the cerebral white matter are nonspecific, but may reflect minimal chronic small vessel ischemic disease. 3. Left maxillary sinus mucous retention cyst. Electronically Signed   By: Jackey Loge   On: 01/15/2019 22:33    Procedures Procedures (including critical care time)  Medications Ordered in ED Medications  nitroGLYCERIN (NITROSTAT) SL tablet 0.4 mg (has no administration in time range)  sodium chloride flush (NS) 0.9 % injection  3 mL (3 mLs Intravenous Given 01/15/19 1626)  acetaminophen (TYLENOL) tablet 1,000 mg (1,000 mg Oral Given 01/15/19 1626)  metoCLOPramide (REGLAN) injection 10 mg (10 mg Intravenous Given 01/15/19 1626)  diphenhydrAMINE (BENADRYL) injection 25 mg (25 mg Intravenous Given 01/15/19 1627)  iohexol (OMNIPAQUE) 350 MG/ML injection 75 mL (75 mLs Intravenous Contrast Given 01/15/19 1807)  LORazepam (ATIVAN) tablet 2 mg (2 mg Oral Given 01/15/19 2120)     Initial Impression / Assessment and Plan / ED Course  I have reviewed the triage vital signs and the nursing notes.  Pertinent labs & imaging results that were available during my care of the patient were reviewed by me and considered in my medical decision making (see chart for details).  Clinical Course as of Jan 15 2312  Thu Jan 15, 2019  1637 Troponin I (High Sensitivity): <2 [JL]  2135 D-Dimer, Quant: <0.27 [JL]  2135 Troponin I (High Sensitivity): <2 [JL]    Clinical Course User Index [JL]  Regan Lemming, MD    HEAR Score: 4  The patient is a 48 year old female with a history of anxiety and depression who presents with chest pain and stroke like symptoms with RUE numbness and a mildly ataxic gait. Symptoms have been present for the past two days. She continues to experience chest pain in the ED and received 324mg  ASA at her PCP's office.   Ddx of her chest pain: ACS, Anxiety, PE, PTX.  EKG in the ED revealed sinus rhythm, rate 89, borderline repolarization abnormalities with a T-wave inversion in lead III, QTc normal 417.   CXR: No pleural effusion or PTX, normal exam.  Ddx: of her neurologic sx: CVA, vertebrobasilar insufficiency, vestibular migraine, acute vestibular neuronitis/labyrinthitis.  The patient's headache was treated with Tylenol, IV Reglan and IV Benadryl, resulting in resolution of her symptoms.   Regarding the patient's chest discomfort, favor likely anxiety as the cause, however, her HEAR score is 4. Her delta troponins were both negative and her d-dimer was also negative. Shared decision making was conducted with the patient regarding her chest discomfort. She was offered admission for a stress test given her elevated HEAR score. There is no evidence of ACS or PE on labs or EKG. Doubt aortic dissection based on HPI. Her symptoms may be due to hypertension and anxiety. The patient agreed to follow-up in clinic with her PCP.   The patient had a negative CTA and a negative MRI. No evidence of stroke or vertebrobasilar insufficiency. Favor possible vestibular migraine as the cause of her symptoms. Additional consideration given to peripheral neuropathy from spinal canal stenosis. If her symptoms persist, I recommended that she follow-up with her PCP to discuss additional imaging. The patient was reassessed prior to discharge and found to be resting comfortably, with resolved headache, mild persistent numbness in her RUE, and stable vitals. She was subsequently  discharged.  Final Clinical Impressions(s) / ED Diagnoses   Final diagnoses:  Anxiety  Palpitations  Numbness and tingling of right arm  Acute nonintractable headache, unspecified headache type  Hypertension, unspecified type    ED Discharge Orders    None       Regan Lemming, MD 01/15/19 2313    Carmin Muskrat, MD 01/16/19 2355

## 2019-01-15 NOTE — ED Notes (Signed)
Pt now reports chest pain "it feels like someone is squeezing my heart."

## 2019-01-15 NOTE — Progress Notes (Signed)
1:37 PM  Reviewed EKG. No prior available for review.  Tachycardic rate 102. Nonspecific ST depression, no elevation.  Patient is now having chest pressure, nausea.  EMS called for transport. ASA 324mg  chewable given.   1:39 PM EMS on scene. Report given by Jens Som, NP with transfer of care.

## 2019-01-15 NOTE — Patient Instructions (Signed)
° ° ° °  If you have lab work done today you will be contacted with your lab results within the next 2 weeks.  If you have not heard from us then please contact us. The fastest way to get your results is to register for My Chart. ° ° °IF you received an x-ray today, you will receive an invoice from [facility]. Please contact [facility] at [phone] with questions or concerns regarding your invoice.  ° °IF you received labwork today, you will receive an invoice from [facility]. Please contact [facility] at [phone] with questions or concerns regarding your invoice.  ° °Our billing staff will not be able to assist you with questions regarding bills from these companies. ° °You will be contacted with the lab results as soon as they are available. The fastest way to get your results is to activate your My Chart account. Instructions are located on the last page of this paperwork. If you have not heard from us regarding the results in 2 weeks, please contact this office. °  ° ° ° °

## 2019-01-15 NOTE — Progress Notes (Signed)
.   Chief Complaint  Patient presents with  . Elavated B/P    x 2 days pt states she has some dizziness and some arm pain on the right side. She also states she has some leg numbness on the right side  . Altered Mental Status    HPI   Patient is a 48 year old female who presents today after being sent home from work yesterday due to extreme BP elevation.  She has been taking her BP since then with the lowest 130/84 and highest 158/106. Presents today with chest pain central and radiating into her right arm with some tingling and numbness.  She is having nausea and feels like something is stuck in her throat.  Her pulse feels that it is bounding and she endorses shortness of breath. She states she feels like she is drunk and weak and wobbly.  She has no history of Hypertension or chest pain in the past.    4 review of systems  Past Medical History:  Diagnosis Date  . Anxiety and depression   . Chest pain 01/15/2019  . EKG, abnormal 01/15/2019  . Hypertensive urgency 01/15/2019    Current Outpatient Medications  Medication Sig Dispense Refill  . escitalopram (LEXAPRO) 20 MG tablet Take 20 mg by mouth daily.    . traZODone (DESYREL) 150 MG tablet Take 0.5-1 tablets (75-150 mg total) by mouth at bedtime. 90 tablet 3   No current facility-administered medications for this visit.     Allergies:  Allergies  Allergen Reactions  . Codeine Nausea And Vomiting  . Latex   . Sulfa Antibiotics Other (See Comments)    Hallucinations,   . Penicillins Diarrhea and Rash    Past Surgical History:  Procedure Laterality Date  . TOTAL ABDOMINAL HYSTERECTOMY  11/14/2015    Social History   Socioeconomic History  . Marital status: Legally Separated    Spouse name: Not on file  . Number of children: 1  . Years of education: Not on file  . Highest education level: Not on file  Occupational History  . Not on file  Social Needs  . Financial resource strain: Not on file  . Food insecurity    Worry: Not on file    Inability: Not on file  . Transportation needs    Medical: Not on file    Non-medical: Not on file  Tobacco Use  . Smoking status: Former Smoker    Packs/day: 1.00    Years: 30.00    Pack years: 30.00    Types: Cigarettes    Quit date: 11/13/2015    Years since quitting: 3.1  . Smokeless tobacco: Never Used  Substance and Sexual Activity  . Alcohol use: No    Alcohol/week: 0.0 standard drinks  . Drug use: Never  . Sexual activity: Not Currently  Lifestyle  . Physical activity    Days per week: Not on file    Minutes per session: Not on file  . Stress: Not on file  Relationships  . Social Herbalist on phone: Not on file    Gets together: Not on file    Attends religious service: Not on file    Active member of club or organization: Not on file    Attends meetings of clubs or organizations: Not on file    Relationship status: Not on file  Other Topics Concern  . Not on file  Social History Narrative   Lives alone   Works  for [employer]   Children - 40 y/o   Grandchild - 1    Family History  Problem Relation Age of Onset  . Heart attack Mother   . Melanoma Father   . Multiple sclerosis Father   . Diabetes Father      Cardiovascular ROS: positive for - chest pain, dyspnea on exertion, rapid heart rate and shortness of breath and radiating chest pain into the right arm.  Positive for nausea, dizziness, weakness.    Objective: Vitals:   01/15/19 1316  BP: (!) 146/86  Pulse: (!) 111  Resp: 16  Temp: 98.4 F (36.9 C)  TempSrc: Oral  SpO2: 98%  Weight: 146 lb (66.2 kg)  Height: 5' 4.88" (1.648 m)    Physical Exam Constitutional:      General: She is not in acute distress.    Appearance: She is ill-appearing. She is not diaphoretic.  HENT:     Head: Normocephalic.  Neck:     Vascular: No carotid bruit.  Cardiovascular:     Rate and Rhythm: Regular rhythm. Tachycardia present.     Pulses: Normal pulses.      Heart sounds: Normal heart sounds.  Chest:     Chest wall: Tenderness present.  Skin:    General: Skin is warm and dry.     Coloration: Skin is pale.  Neurological:     Mental Status: She is alert.     Motor: Weakness present.     Gait: Gait abnormal.  Psychiatric:        Mood and Affect: Mood normal.        Behavior: Behavior normal.        Thought Content: Thought content normal.        Judgment: Judgment normal.     EKG in the office abnormal with ST depression and non-specific T wave depressions.    Assessment and Plan:   Given she has symptoms:  Abnormal EKG, symptomatic SOB, chest pain, radiation to arm, nausea, dizziness, SOB, and weakness, the decision was made in conjunction with Dr. Chilton Si to call EMS and transfer care.  EMS arrived immediately and patient was taken to the ER to rule out ACS, or MI.    Diagnoses and all orders for this visit:  Essential hypertension -     EKG 12-Lead -     aspirin chewable tablet 324 mg  Unstable angina pectoris (HCC)  EKG, abnormal  Hypertensive urgency     Emma James

## 2019-01-15 NOTE — Discharge Instructions (Addendum)
Your imaging which included a CTA of the head and MRI of the brain was negative. There is no evidence of acute stroke to explain your symptoms. You might benefit from further imaging of your cervical spine if you continue to experience numbness in your right arm. Your labs, chest XR, and EKG were reassuring. We do not suspect heart attack or a blood clot in your lungs. We recommend that you follow-up with your primary doctor in clinic to discuss further.

## 2019-01-15 NOTE — ED Triage Notes (Signed)
Pt went to her primary MD office for HTN, dizziness, some confusion. Pt reports decreased sensation on the right side started 10 am yesterday at work- pt checked BP at work and was hypertensive. Denies weakness. Grips equal. Pt  felt like she has been off balance. Pt has had some nausea as well. Pt has no hx of HTN. 170/110, HR 100 SR, CBG 133. sats 100% on room air.

## 2019-01-15 NOTE — ED Notes (Signed)
Got patient on the monitor did ekg patient is resting with nurse at bedside 

## 2019-01-15 NOTE — ED Notes (Signed)
Patient transported to CT 

## 2019-01-16 ENCOUNTER — Ambulatory Visit (INDEPENDENT_AMBULATORY_CARE_PROVIDER_SITE_OTHER): Payer: 59 | Admitting: Adult Health Nurse Practitioner

## 2019-01-16 ENCOUNTER — Ambulatory Visit: Payer: 59 | Admitting: Family Medicine

## 2019-01-16 ENCOUNTER — Encounter: Payer: Self-pay | Admitting: Adult Health Nurse Practitioner

## 2019-01-16 ENCOUNTER — Telehealth: Payer: Self-pay

## 2019-01-16 VITALS — BP 133/89 | HR 86 | Temp 98.4°F | Ht 64.0 in | Wt 145.0 lb

## 2019-01-16 DIAGNOSIS — R269 Unspecified abnormalities of gait and mobility: Secondary | ICD-10-CM

## 2019-01-16 MED ORDER — METOPROLOL SUCCINATE ER 25 MG PO TB24: 25.0000 mg | ORAL_TABLET | Freq: Every day | ORAL | 2 refills | Status: DC

## 2019-01-16 NOTE — Patient Instructions (Addendum)
   If you have lab work done today you will be contacted with your lab results within the next 2 weeks.  If you have not heard from us then please contact us. The fastest way to get your results is to register for My Chart.   IF you received an x-ray today, you will receive an invoice from [facility]. Please contact [facility] at [phone] with questions or concerns regarding your invoice.   IF you received labwork today, you will receive an invoice from [facility]. Please contact [facility] at [phone] with questions or concerns regarding your invoice.   Our billing staff will not be able to assist you with questions regarding bills from these companies.  You will be contacted with the lab results as soon as they are available. The fastest way to get your results is to activate your My Chart account. Instructions are located on the last page of this paperwork. If you have not heard from us regarding the results in 2 weeks, please contact this office.     Hypertension, Adult High blood pressure (hypertension) is when the force of blood pumping through the arteries is too strong. The arteries are the blood vessels that carry blood from the heart throughout the body. Hypertension forces the heart to work harder to pump blood and may cause arteries to become narrow or stiff. Untreated or uncontrolled hypertension can cause a heart attack, heart failure, a stroke, kidney disease, and other problems. A blood pressure reading consists of a higher number over a lower number. Ideally, your blood pressure should be below 120/80. The first ("top") number is called the systolic pressure. It is a measure of the pressure in your arteries as your heart beats. The second ("bottom") number is called the diastolic pressure. It is a measure of the pressure in your arteries as the heart relaxes. What are the causes? The exact cause of this condition is not known. There are some conditions  that result in or are related to high blood pressure. What increases the risk? Some risk factors for high blood pressure are under your control. The following factors may make you more likely to develop this condition:  Smoking.  Having type 2 diabetes mellitus, high cholesterol, or both.  Not getting enough exercise or physical activity.  Being overweight.  Having too much fat, sugar, calories, or salt (sodium) in your diet.  Drinking too much alcohol. Some risk factors for high blood pressure may be difficult or impossible to change. Some of these factors include:  Having chronic kidney disease.  Having a family history of high blood pressure.  Age. Risk increases with age.  Race. You may be at higher risk if you are African American.  Gender. Men are at higher risk than women before age 45. After age 65, women are at higher risk than men.  Having obstructive sleep apnea.  Stress. What are the signs or symptoms? High blood pressure may not cause symptoms. Very high blood pressure (hypertensive crisis) may cause:  Headache.  Anxiety.  Shortness of breath.  Nosebleed.  Nausea and vomiting.  Vision changes.  Severe chest pain.  Seizures. How is this diagnosed? This condition is diagnosed by measuring your blood pressure while you are seated, with your arm resting on a flat surface, your legs uncrossed, and your feet flat on the floor. The cuff of the blood pressure monitor will be placed directly against the skin of your upper arm at the level of your heart.   It should be measured at least twice using the same arm. Certain conditions can cause a difference in blood pressure between your right and left arms. Certain factors can cause blood pressure readings to be lower or higher than normal for a short period of time:  When your blood pressure is higher when you are in a health care provider's office than when you are at home, this is called white coat hypertension.  Most people with this condition do not need medicines.  When your blood pressure is higher at home than when you are in a health care provider's office, this is called masked hypertension. Most people with this condition may need medicines to control blood pressure. If you have a high blood pressure reading during one visit or you have normal blood pressure with other risk factors, you may be asked to:  Return on a different day to have your blood pressure checked again.  Monitor your blood pressure at home for 1 week or longer. If you are diagnosed with hypertension, you may have other blood or imaging tests to help your health care provider understand your overall risk for other conditions. How is this treated? This condition is treated by making healthy lifestyle changes, such as eating healthy foods, exercising more, and reducing your alcohol intake. Your health care provider may prescribe medicine if lifestyle changes are not enough to get your blood pressure under control, and if:  Your systolic blood pressure is above 130.  Your diastolic blood pressure is above 80. Your personal target blood pressure may vary depending on your medical conditions, your age, and other factors. Follow these instructions at home: Eating and drinking   Eat a diet that is high in fiber and potassium, and low in sodium, added sugar, and fat. An example eating plan is called the DASH (Dietary Approaches to Stop Hypertension) diet. To eat this way: ? Eat plenty of fresh fruits and vegetables. Try to fill one half of your plate at each meal with fruits and vegetables. ? Eat whole grains, such as whole-wheat pasta, brown rice, or whole-grain bread. Fill about one fourth of your plate with whole grains. ? Eat or drink low-fat dairy products, such as skim milk or low-fat yogurt. ? Avoid fatty cuts of meat, processed or cured meats, and poultry with skin. Fill about one fourth of your plate with lean proteins, such  as fish, chicken without skin, beans, eggs, or tofu. ? Avoid pre-made and processed foods. These tend to be higher in sodium, added sugar, and fat.  Reduce your daily sodium intake. Most people with hypertension should eat less than 1,500 mg of sodium a day.  Do not drink alcohol if: ? Your health care provider tells you not to drink. ? You are pregnant, may be pregnant, or are planning to become pregnant.  If you drink alcohol: ? Limit how much you use to:  0-1 drink a day for women.  0-2 drinks a day for men. ? Be aware of how much alcohol is in your drink. In the [place] one drink equals one 12 oz bottle of beer (355 mL), one 5 oz glass of wine (148 mL), or one 1 oz glass of hard liquor (44 mL). Lifestyle   Work with your health care provider to maintain a healthy body weight or to lose weight. Ask what an ideal weight is for you.  Get at least 30 minutes of exercise most days of the week. Activities may include walking, swimming, or   biking.  Include exercise to strengthen your muscles (resistance exercise), such as Pilates or lifting weights, as part of your weekly exercise routine. Try to do these types of exercises for 30 minutes at least 3 days a week.  Do not use any products that contain nicotine or tobacco, such as cigarettes, e-cigarettes, and chewing tobacco. If you need help quitting, ask your health care provider.  Monitor your blood pressure at home as told by your health care provider.  Keep all follow-up visits as told by your health care provider. This is important. Medicines  Take over-the-counter and prescription medicines only as told by your health care provider. Follow directions carefully. Blood pressure medicines must be taken as prescribed.  Do not skip doses of blood pressure medicine. Doing this puts you at risk for problems and can make the medicine less effective.  Ask your health care provider about side effects or reactions to medicines that you  should watch for. Contact a health care provider if you:  Think you are having a reaction to a medicine you are taking.  Have headaches that keep coming back (recurring).  Feel dizzy.  Have swelling in your ankles.  Have trouble with your vision. Get help right away if you:  Develop a severe headache or confusion.  Have unusual weakness or numbness.  Feel faint.  Have severe pain in your chest or abdomen.  Vomit repeatedly.  Have trouble breathing. Summary  Hypertension is when the force of blood pumping through your arteries is too strong. If this condition is not controlled, it may put you at risk for serious complications.  Your personal target blood pressure may vary depending on your medical conditions, your age, and other factors. For most people, a normal blood pressure is less than 120/80.  Hypertension is treated with lifestyle changes, medicines, or a combination of both. Lifestyle changes include losing weight, eating a healthy, low-sodium diet, exercising more, and limiting alcohol. This information is not intended to replace advice given to you by your health care provider. Make sure you discuss any questions you have with your health care provider. Document Released: 03/26/2005 Document Revised: 12/04/2017 Document Reviewed: 12/04/2017 Elsevier Patient Education  2020 [employer]  

## 2019-01-21 ENCOUNTER — Ambulatory Visit: Payer: Self-pay

## 2019-01-21 ENCOUNTER — Ambulatory Visit: Payer: Self-pay | Admitting: Adult Health Nurse Practitioner

## 2019-01-21 NOTE — Telephone Encounter (Signed)
Call was disconected. Returned call to patient.  She states she is driving. She has recently had metoprolol added to her medications for BP.  She states her BP was better until last night when she stated to notice symptoms. Today's latest BP 158/104 HR 94.  She states she has chest pain blurred vision.headache and difficulty breathing. "all the above". Per protocol patient will go to ER for evaluation. Care advice read to patient.  She verbalized understanding of all information. Note will be routed to office.  Reason for Disposition . [1] Systolic BP  >= 607 OR Diastolic >= 371 AND [0] cardiac or neurologic symptoms (e.g., chest pain, difficulty breathing, unsteady gait, blurred vision)  Answer Assessment - Initial Assessment Questions 1. BLOOD PRESSURE: "What is the blood pressure?" "Did you take at least two measurements 5 minutes apart?"     158/104 HR94 2. ONSET: "When did you take your blood pressure?"     Last night started to go last night 3. HOW: "How did you obtain the blood pressure?" (e.g., visiting nurse, automatic home BP monitor)    Home monitor 4. HISTORY: "Do you have a history of high blood pressure?"    Yes recently placed on new medication 5. MEDICATIONS: "Are you taking any medications for blood pressure?" "Have you missed any doses recently?"     No 6. OTHER SYMPTOMS: "Do you have any symptoms?" (e.g., headache, chest pain, blurred vision, difficulty breathing, weakness)    Yes all 7. PREGNANCY: "Is there any chance you are pregnant?" "When was your last menstrual period?"     No no uterus  Protocols used: HIGH BLOOD PRESSURE-A-AH

## 2019-01-21 NOTE — Telephone Encounter (Signed)
Pt states seen at practice last week and placed on metoprolol for hypertension. States "BP has been good until last night," 151/93. This AM 158/104, HR 75-82. Reports mild lightheadedness, slight chest pressure. Also states temp checked prior to work at workplace, 99.0. Reports headache 5/10. No weakness, states lightheaded at times earlier. No missed doses of BP med, started first dose 01/16/2019. Called received during practice lunch hour, pt assured [place] would rote to practice for review and consideration of virtual appt. Advised ED if symptoms worsen, increased headache, chest tightness, CP, SOB, weakness. Pt verbalizes understanding.  Please advise: [phone]  Reason for Disposition . Systolic BP  >= 299 OR Diastolic >= 242  Answer Assessment - Initial Assessment Questions 1. BLOOD PRESSURE: "What is the blood pressure?" "Did you take at least two measurements 5 minutes apart?"     158/104 2. ONSET: "When did you take your blood pressure?"     Prior to call 3. HOW: "How did you obtain the blood pressure?" (e.g., visiting nurse, automatic home BP monitor)     Home monitor 4. HISTORY: "Do you have a history of high blood pressure?"     yes 5. MEDICATIONS: "Are you taking any medications for blood pressure?" "Have you missed any doses recently?"     Yes, just recent 6. OTHER SYMPTOMS: "Do you have any symptoms?" (e.g., headache, chest pain, blurred vision, difficulty breathing, weakness)     Headache, 5/10.  Chest pressure, upper chest, little lightheaded.  Protocols used: HIGH BLOOD PRESSURE-A-AH

## 2019-01-23 ENCOUNTER — Encounter: Payer: Self-pay | Admitting: Adult Health Nurse Practitioner

## 2019-01-23 ENCOUNTER — Ambulatory Visit: Payer: 59 | Admitting: Adult Health Nurse Practitioner

## 2019-01-23 ENCOUNTER — Telehealth: Payer: Self-pay | Admitting: Adult Health Nurse Practitioner

## 2019-01-23 ENCOUNTER — Other Ambulatory Visit: Payer: Self-pay

## 2019-01-23 VITALS — BP 156/88 | HR 87 | Temp 98.9°F | Ht 64.0 in | Wt 147.6 lb

## 2019-01-23 DIAGNOSIS — I10 Essential (primary) hypertension: Secondary | ICD-10-CM

## 2019-01-23 DIAGNOSIS — E042 Nontoxic multinodular goiter: Secondary | ICD-10-CM

## 2019-01-23 HISTORY — DX: Essential (primary) hypertension: I10

## 2019-01-23 MED ORDER — HYDROCHLOROTHIAZIDE 12.5 MG PO TABS: 12.5000 mg | ORAL_TABLET | Freq: Every day | ORAL | 1 refills | Status: DC

## 2019-01-23 NOTE — Telephone Encounter (Signed)
See separate encounter

## 2019-01-23 NOTE — Progress Notes (Signed)
Subjective:  Emma James is a 48 y.o. female with hypertension. Presents after being on Metoprolol XR for 1 week.  Good relief with BP responding initially.  Now, notes that during work hours, BP will go up to 732K GURKYHCW/23J/62G diastolic.  When she returns home in the afternoon. BP will lower to 315V systolic over 76H diastolic.       Current Outpatient Medications  Medication Sig Dispense Refill  . escitalopram (LEXAPRO) 20 MG tablet Take 20 mg by mouth daily.    . metoprolol succinate (TOPROL-XL) 25 MG 24 hr tablet Take 1 tablet (25 mg total) by mouth daily. 30 tablet 2  . Multiple Vitamins-Minerals (WOMENS 50+ MULTI VITAMIN/MIN PO) Take 1 tablet by mouth daily.    . traZODone (DESYREL) 150 MG tablet Take 0.5-1 tablets (75-150 mg total) by mouth at bedtime. 90 tablet 3   No current facility-administered medications for this visit.     Hypertension ROS: taking medications as instructed, no medication side effects noted, no TIA's, no chest pain on exertion, no dyspnea on exertion, no swelling of ankles and no orthopnea or paroxysmal nocturnal dyspnea.  New concerns: BP elevation during day at work .   Objective:  BP (!) 156/88 (BP Location: Right Arm, Patient Position: Sitting, Cuff Size: Normal)   Pulse 87   Temp 98.9 F (37.2 C) (Oral)   Ht 5\' 4"  (1.626 m)   Wt 147 lb 9.6 oz (67 kg)   LMP 09/06/2015 (Approximate)   SpO2 97%   BMI 25.34 kg/m   Appearance alert, well appearing, and in no distress. General exam BP noted to be moderately elevated today in office, S1, S2 normal, no gallop, no murmur, chest clear, no JVD, no HSM, no edema.  Lab review: labs are reviewed, up to date and normal.   Assessment:   Hypertension, improved overall since last visit but still elevated during day at job during times of stress.   Plan:  Recommended sodium restriction. Reviewed medications and side effects in detail. Reviewed potential future medication changes and side effects. The  following changes are to be made:, increase Metoprolol to 50mg XR add HCTZ 12.5mg  and if tolerates, can increase to 25mg   Follow up: 4 weeks and as needed. Patient verbalized understanding and is satisfied with plan.

## 2019-01-23 NOTE — Telephone Encounter (Signed)
Call to pt re recent conversations and s/s reported to NT.  Pt states she started Toprol 10/09 and it was working well until 10/14 at which time she reported an increase in BP to 150s/80s-90s.  Reports that the BP is ok in morning and increases throughout day while at work.  She also experiences mental fogginess and weird feeling in chest during this time.  States that when she goes home, BP lowers again but that the weird feeling in chest has persisted.  Pt states she did not go to ER as advised in 10/14 conversation with NT.  Appt made to be seen in clinic today.  Provider aware.

## 2019-01-23 NOTE — Telephone Encounter (Signed)
Pt is needing to hear back from Dr, Felton Clinton see previous triage nurse notes . Please call pt asap    [phone] please call regarding  to this new med   FR   Ok to leave voice mail . Tell her what she needs to do or not do

## 2019-01-23 NOTE — Patient Instructions (Signed)
° ° ° °  If you have lab work done today you will be contacted with your lab results within the next 2 weeks.  If you have not heard from us then please contact us. The fastest way to get your results is to register for My Chart. ° ° °IF you received an x-ray today, you will receive an invoice from [facility]. Please contact  Radiology at [phone] with questions or concerns regarding your invoice.  ° °IF you received labwork today, you will receive an invoice from [facility]. Please contact [facility] at [phone] with questions or concerns regarding your invoice.  ° °Our billing staff will not be able to assist you with questions regarding bills from these companies. ° °You will be contacted with the lab results as soon as they are available. The fastest way to get your results is to activate your My Chart account. Instructions are located on the last page of this paperwork. If you have not heard from us regarding the results in 2 weeks, please contact this office. °  ° ° ° °

## 2019-01-23 NOTE — Progress Notes (Signed)
Subjective:  Emma James is a 48 y.o. female with hypertension. Fortunately, she did not have ACS or an MI when sent to the ER yesterday.  She was treated with a Migraine cocktail that did help her.  Since returning home, her Bps have been running lowest 139/100-150/94.   She reports a hx of anxiety but has no symptoms she was aware of now until she started having problems with elevated blood pressure.   She expresses a need to get back to work.   She denies any cardiac symptoms currently.   Current Outpatient Medications  Medication Sig Dispense Refill  . escitalopram (LEXAPRO) 20 MG tablet Take 20 mg by mouth daily.    . metoprolol succinate (TOPROL-XL) 25 MG 24 hr tablet Take 1 tablet (25 mg total) by mouth daily. 30 tablet 2  . Multiple Vitamins-Minerals (WOMENS 50+ MULTI VITAMIN/MIN PO) Take 1 tablet by mouth daily.    . traZODone (DESYREL) 150 MG tablet Take 0.5-1 tablets (75-150 mg total) by mouth at bedtime. 90 tablet 3   No current facility-administered medications for this visit.     Hypertension ROS: no chest pain on exertion, no dyspnea on exertion and no swelling of ankles.  Review of Systems See HPI Constitution: No fevers or chills No malaise No diaphoresis Skin: No rash or itching Eyes: no blurry vision, no double vision GU: no dysuria or hematuria Neuro: no dizziness or headaches .   Objective:  BP 133/89 (BP Location: Right Arm, Patient Position: Sitting, Cuff Size: Normal)   Pulse 86   Temp 98.4 F (36.9 C) (Oral)   Ht 5\' 4"  (1.626 m)   Wt 145 lb (65.8 kg)   LMP 09/06/2015 (Approximate)   SpO2 98%   BMI 24.89 kg/m   Appearance alert, well appearing, and in no distress. General exam BP elevated in office today.  S1, S2 normal, no gallop, no murmur, chest clear, no JVD, no HSM, no edema.  Lab review: labs are reviewed, up to date and normal.   Assessment:   Hypertension poorly controlled.   Plan:   I have discussed with her medications for HTN.  Given  the elevation in her pulse and the anxiety she feels in general; it would not be unreasonable to prescribe a beta blocker.  Will start with Metoprolol XR 25mg  initially to see if it offers any better control.  I have asked her to return in 6 weeks.  She is inline with this plan.

## 2019-01-24 LAB — THYROID PANEL WITH TSH
Free Thyroxine Index: 1.9 (ref 1.2–4.9)
T3 Uptake Ratio: 23 % — ABNORMAL LOW (ref 24–39)
T4, Total: 8.3 ug/dL (ref 4.5–12.0)
TSH: 0.92 u[IU]/mL (ref 0.450–4.500)

## 2019-01-24 LAB — THYROID ANTIBODIES
Thyroglobulin Antibody: <1 IU/mL (ref 0.0–0.9)
Thyroperoxidase Ab SerPl-aCnc: <9 IU/mL (ref 0–34)

## 2019-01-27 ENCOUNTER — Ambulatory Visit (HOSPITAL_COMMUNITY): Payer: 59

## 2019-01-27 DIAGNOSIS — H5213 Myopia, bilateral: Secondary | ICD-10-CM | POA: Diagnosis not present

## 2019-02-05 ENCOUNTER — Telehealth: Payer: Self-pay | Admitting: Adult Health Nurse Practitioner

## 2019-02-05 NOTE — Telephone Encounter (Signed)
Pt called endo office and office stated they are waiting on provider to sign off on the referral  The medication prescribed to [facility] is working well  She has an MRI on Tuesday and would like a prescription to relax her nerves  Please advise

## 2019-02-05 NOTE — Telephone Encounter (Deleted)
Medication is working well Referral to endo  MRI on Tuesday and pt requesting med to relax her

## 2019-02-09 NOTE — Telephone Encounter (Signed)
Pt called to check on status of prescription to calm her nerves for tomorrow's MRI. Preferred pharmacy is [facility] on [address]. Pt expecting prescription today

## 2019-02-09 NOTE — Telephone Encounter (Signed)
Emma Kaufman,   Ms. Bouton is scheduled for her MRI tomorrow but feels she will need something to keep her calm during the scan.  Would you be willing to send something in to [facility] on [address]?   Thank you,  Wilfred Curtis

## 2019-02-10 ENCOUNTER — Ambulatory Visit (HOSPITAL_COMMUNITY)
Admission: RE | Admit: 2019-02-10 | Discharge: 2019-02-10 | Disposition: A | Discharge status: Home / Self Care | Payer: 59 | Source: Ambulatory Visit | Attending: Adult Health Nurse Practitioner | Admitting: Adult Health Nurse Practitioner

## 2019-02-10 ENCOUNTER — Other Ambulatory Visit: Payer: Self-pay

## 2019-02-10 DIAGNOSIS — R269 Unspecified abnormalities of gait and mobility: Secondary | ICD-10-CM

## 2019-02-10 DIAGNOSIS — M4802 Spinal stenosis, cervical region: Secondary | ICD-10-CM | POA: Diagnosis not present

## 2019-02-10 DIAGNOSIS — M5021 Other cervical disc displacement,  high cervical region: Secondary | ICD-10-CM | POA: Diagnosis not present

## 2019-02-10 DIAGNOSIS — M50221 Other cervical disc displacement at C4-C5 level: Secondary | ICD-10-CM | POA: Diagnosis not present

## 2019-02-10 DIAGNOSIS — M50222 Other cervical disc displacement at C5-C6 level: Secondary | ICD-10-CM | POA: Diagnosis not present

## 2019-02-11 ENCOUNTER — Encounter: Payer: Self-pay | Admitting: Adult Health Nurse Practitioner

## 2019-02-11 ENCOUNTER — Telehealth: Payer: Self-pay | Admitting: Adult Health Nurse Practitioner

## 2019-02-11 DIAGNOSIS — E042 Nontoxic multinodular goiter: Secondary | ICD-10-CM

## 2019-02-11 DIAGNOSIS — E010 Iodine-deficiency related diffuse (endemic) goiter: Secondary | ICD-10-CM

## 2019-02-11 NOTE — Telephone Encounter (Signed)
Pt states she missed a cb. Please advise, she said it's ok to leave a vm.[phone]

## 2019-02-12 ENCOUNTER — Encounter: Payer: Self-pay | Admitting: *Deleted

## 2019-02-12 NOTE — Telephone Encounter (Signed)
Mychart message sent.

## 2019-02-13 DIAGNOSIS — Z1231 Encounter for screening mammogram for malignant neoplasm of breast: Secondary | ICD-10-CM | POA: Diagnosis not present

## 2019-02-17 ENCOUNTER — Telehealth: Payer: Self-pay | Admitting: Adult Health Nurse Practitioner

## 2019-02-17 NOTE — Telephone Encounter (Signed)
Pt called and said that there is a referral placed for Emma James. She has called them 4 times and said they told her that they are waiting for the provider to authorize the visit. I see that it is a covered benefit and she wants to go somewhere else since they are giving her such a hard time on getting in. It's time sensative per patient

## 2019-02-18 DIAGNOSIS — R87619 Unspecified abnormal cytological findings in specimens from cervix uteri: Secondary | ICD-10-CM

## 2019-02-18 DIAGNOSIS — Z01419 Encounter for gynecological examination (general) (routine) without abnormal findings: Secondary | ICD-10-CM | POA: Diagnosis not present

## 2019-02-18 DIAGNOSIS — Z6824 Body mass index (BMI) 24.0-24.9, adult: Secondary | ICD-10-CM | POA: Diagnosis not present

## 2019-02-18 HISTORY — DX: Unspecified abnormal cytological findings in specimens from cervix uteri: R87.619

## 2019-02-25 ENCOUNTER — Ambulatory Visit
Admission: RE | Admit: 2019-02-25 | Discharge: 2019-02-25 | Disposition: A | Discharge status: Home / Self Care | Payer: 59 | Source: Ambulatory Visit | Attending: Adult Health Nurse Practitioner | Admitting: Adult Health Nurse Practitioner

## 2019-02-25 ENCOUNTER — Other Ambulatory Visit: Payer: 59

## 2019-02-25 DIAGNOSIS — E042 Nontoxic multinodular goiter: Secondary | ICD-10-CM

## 2019-02-25 DIAGNOSIS — E010 Iodine-deficiency related diffuse (endemic) goiter: Secondary | ICD-10-CM

## 2019-02-25 MED ORDER — IOPAMIDOL (ISOVUE-300) INJECTION 61%
75.0000 mL | Freq: Once | INTRAVENOUS | Status: AC | PRN
  Administered 2019-02-25: 75 mL via INTRAVENOUS

## 2019-02-26 ENCOUNTER — Telehealth: Payer: Self-pay | Admitting: *Deleted

## 2019-02-26 NOTE — Telephone Encounter (Signed)
Pt called questioning about the referral and still no schedule for an appt. Called [facility] othopedic)-Dr. Buddy Duty office to verify  and talk to [facility] coordinator stated -told pt that waiting for the notes to get review by the doctor and will set up schedule for the pt. I re-faxed recent notes and labs result to [facility]-Dr. Buddy Duty [phone]. Called pt back and LVM.--update regarding referral and if have any question to call the office back.

## 2019-02-26 NOTE — Telephone Encounter (Signed)
Have reached out to practice manager to determine next best step with referral and securing patient appt.

## 2019-03-04 ENCOUNTER — Telehealth (INDEPENDENT_AMBULATORY_CARE_PROVIDER_SITE_OTHER): Payer: 59 | Admitting: Adult Health Nurse Practitioner

## 2019-03-04 ENCOUNTER — Other Ambulatory Visit: Payer: Self-pay

## 2019-03-04 DIAGNOSIS — E042 Nontoxic multinodular goiter: Secondary | ICD-10-CM | POA: Diagnosis not present

## 2019-03-04 DIAGNOSIS — I10 Essential (primary) hypertension: Secondary | ICD-10-CM | POA: Diagnosis not present

## 2019-03-04 NOTE — Progress Notes (Signed)
CC: Patient states that she would to discuss her CT results. No other concerns.

## 2019-03-06 MED ORDER — HYDROCHLOROTHIAZIDE 12.5 MG PO TABS: 12.5000 mg | ORAL_TABLET | Freq: Every day | ORAL | 3 refills | Status: DC

## 2019-03-06 MED ORDER — METOPROLOL SUCCINATE ER 25 MG PO TB24: 25.0000 mg | ORAL_TABLET | Freq: Every day | ORAL | 2 refills | Status: DC

## 2019-03-06 NOTE — Progress Notes (Signed)
Telemedicine Encounter- SOAP NOTE Established Patient  This telephone encounter was conducted with the patient's (or proxy's) verbal consent via audio telecommunications: yes/no: Yes Patient was instructed to have this encounter in a suitably private space; and to only have persons present to whom they give permission to participate. In addition, patient identity was confirmed by use of name plus two identifiers (DOB and address).  I discussed the limitations, risks, security and privacy concerns of performing an evaluation and management service by telephone and the availability of in person appointments. I also discussed with the patient that there may be a patient responsible charge related to this service. The patient expressed understanding and agreed to proceed.  I spent a total of TIME; 0 MIN TO 60 MIN: 15 minutes talking with the patient or their proxy.  No chief complaint on file.   Subjective   Emma James is a 48 y.o. established patient. Telephone visit today for review of CT of neck.     HPI   Patient and I discussed recent results of her CT scan of the neck which showed multiple thyroid nodules with 2 calcified nodules.  She has had a hard time getting in to endocrinology and now has an appointment for mid-January. She is interested in getting in to somewhere sooner than her next appointment.  She does experience some sensitivity to heat but not cool. No excessive fatigue.  BP has been running approximately 130s systolic over 80s diastolic--only 2 readings that were as high as previous.   Denies chest pain, chest pressure, SOB.    Patient Active Problem List   Diagnosis Date Noted  . Essential hypertension 01/23/2019  . Chest pain 01/15/2019  . EKG, abnormal 01/15/2019  . Multinodular thyroid 10/17/2017  . Depression with anxiety 01/25/2015  . 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 Anxiety and  depression   . Chest pain 01/15/2019  . EKG, abnormal 01/15/2019  . Essential hypertension 01/23/2019  . Hypertensive urgency 01/15/2019    Current Outpatient Medications  Medication Sig Dispense Refill  . escitalopram (LEXAPRO) 20 MG tablet Take 20 mg by mouth daily.    . hydrochlorothiazide (HYDRODIURIL) 12.5 MG tablet Take 1 tablet (12.5 mg total) by mouth daily. 30 tablet 1  . metoprolol succinate (TOPROL-XL) 25 MG 24 hr tablet Take 1 tablet (25 mg total) by mouth daily. 30 tablet 2  . Multiple Vitamins-Minerals (WOMENS 50+ MULTI VITAMIN/MIN PO) Take 1 tablet by mouth daily.    . traZODone (DESYREL) 150 MG tablet Take 0.5-1 tablets (75-150 mg total) by mouth at bedtime. 90 tablet 3   No current facility-administered medications for this visit.     Allergies  Allergen Reactions  . Codeine Nausea And Vomiting  . Latex   . Sulfa Antibiotics Other (See Comments)    Hallucinations,   . Penicillins Diarrhea and Rash    Did it involve swelling of the face/tongue/throat, SOB, or low BP? N Did it involve sudden or severe rash/hives, skin peeling, or any reaction on the inside of your mouth or nose? Y Did you need to seek medical attention at a hospital or doctor's office? N When did it last happen?48 years old  If all above answers are "NO", may proceed with cephalosporin use.    Social History   Socioeconomic History  . Marital status: Legally Separated    Spouse name: Not on file  . Number of children: 1  .  Years of education: Not on file  . Highest education level: Not on file  Occupational History  . Not on file  Social Needs  . Financial resource strain: Not on file  . Food insecurity    Worry: Not on file    Inability: Not on file  . Transportation needs    Medical: Not on file    Non-medical: Not on file  Tobacco Use  . Smoking status: Current Every Day Smoker    Packs/day: 1.00    Types: Cigarettes  . Smokeless tobacco: Never Used  Substance and Sexual Activity   . Alcohol use: Yes    Alcohol/week: 0.0 standard drinks    Comment: occ  . Drug use: Never  . Sexual activity: Not Currently  Lifestyle  . Physical activity    Days per week: Not on file    Minutes per session: Not on file  . Stress: Not on file  Relationships  . Social Herbalist on phone: Not on file    Gets together: Not on file    Attends religious service: Not on file    Active member of club or organization: Not on file    Attends meetings of clubs or organizations: Not on file    Relationship status: Not on file  . Intimate partner violence    Fear of current or ex partner: Not on file    Emotionally abused: Not on file    Physically abused: Not on file    Forced sexual activity: Not on file  Other Topics Concern  . Not on file  Social History Narrative   Lives alone   Works for [employer] - 48 y/o   [place] - 1    ROS   Review of Systems See HPI Constitution: No fevers or chills No malaise No diaphoresis Skin: No rash or itching Eyes: no blurry vision, no double vision GU: no dysuria or hematuria Neuro: no dizziness or headaches   Objective     GEN: , NAD, Non-toxic, Alert & Oriented x 3  PSYCH: Normally interactive. Conversant. Not depressed or anxious appearing.  Calm demeanor.    1. Multinodular thyroid   2. Essential hypertension    Will f/u with [facility] to see if we can get patient seen sooner.   Refill on BP medication.   Patient is inline with plan.    I discussed the assessment and treatment plan with the patient. The patient was provided an opportunity to ask questions and all were answered. The patient agreed with the plan and demonstrated an understanding of the instructions.   The patient was advised to call back or seek an in-person evaluation if the symptoms worsen or if the condition fails to improve as anticipated.  I provided 15 minutes of non-face-to-face time during this encounter.  Glyn Ade, NP  Primary Care at [facility]

## 2019-03-10 DIAGNOSIS — R922 Inconclusive mammogram: Secondary | ICD-10-CM | POA: Diagnosis not present

## 2019-03-10 DIAGNOSIS — R928 Other abnormal and inconclusive findings on diagnostic imaging of breast: Secondary | ICD-10-CM | POA: Diagnosis not present

## 2019-03-10 DIAGNOSIS — N6002 Solitary cyst of left breast: Secondary | ICD-10-CM | POA: Diagnosis not present

## 2019-04-28 DIAGNOSIS — E042 Nontoxic multinodular goiter: Secondary | ICD-10-CM | POA: Diagnosis not present

## 2019-05-29 ENCOUNTER — Other Ambulatory Visit: Payer: Self-pay | Admitting: *Deleted

## 2019-05-29 ENCOUNTER — Other Ambulatory Visit: Payer: Self-pay | Admitting: Adult Health Nurse Practitioner

## 2019-05-29 MED ORDER — METOPROLOL SUCCINATE ER 25 MG PO TB24: 25.0000 mg | ORAL_TABLET | Freq: Every day | ORAL | 0 refills | Status: DC

## 2019-05-29 MED ORDER — HYDROCHLOROTHIAZIDE 12.5 MG PO TABS: 12.5000 mg | ORAL_TABLET | Freq: Every day | ORAL | 0 refills | Status: DC

## 2019-05-29 NOTE — Progress Notes (Signed)
[facility] [address] [facility].  Updated pharmacy per patient's request.

## 2019-05-29 NOTE — Telephone Encounter (Signed)
Medication Refill - Medication: metoprolol and hydrochlorothiazide   Has the patient contacted their pharmacy? Yes.   Pt states the pharmacy has sent over fax. Please advise.  (Agent: If no, request that the patient contact the pharmacy for the refill.) (Agent: If yes, when and what did the pharmacy advise?)  Preferred Pharmacy (with phone number or street name):  [phone] [place], [place] - [address]  [address] [place] [address]  Phone: [phone] Fax: [phone]  Not a 24 hour pharmacy; exact hours not known.     Agent: Please be advised that RX refills may take up to 3 business days. We ask that you follow-up with your pharmacy.

## 2019-05-29 NOTE — Telephone Encounter (Signed)
Requested Prescriptions  Pending Prescriptions Disp Refills  . hydrochlorothiazide (HYDRODIURIL) 12.5 MG tablet 30 tablet 3    Sig: Take 1 tablet (12.5 mg total) by mouth daily.     Cardiovascular: Diuretics - Thiazide Failed - 05/29/2019 10:18 AM      Failed - Last BP in normal range    BP Readings from Last 1 Encounters:  01/23/19 (!) 156/88         Passed - Ca in normal range and within 360 days    Calcium  Date Value Ref Range Status  01/15/2019 9.2 8.9 - 10.3 mg/dL Final         Passed - Cr in normal range and within 360 days    Creatinine, Ser  Date Value Ref Range Status  01/15/2019 0.67 0.44 - 1.00 mg/dL Final         Passed - K in normal range and within 360 days    Potassium  Date Value Ref Range Status  01/15/2019 3.7 3.5 - 5.1 mmol/L Final         Passed - Na in normal range and within 360 days    Sodium  Date Value Ref Range Status  01/15/2019 138 135 - 145 mmol/L Final  09/30/2017 139 134 - 144 mmol/L Final         Passed - Valid encounter within last 6 months    Recent Outpatient Visits          2 months ago Multinodular thyroid   Primary Care at [facility], Lonna Cobb, NP   4 months ago Multinodular goiter   Primary Care at [facility], Lonna Cobb, NP   4 months ago Abnormality of gait   Primary Care at [facility], Lonna Cobb, NP   4 months ago Essential hypertension   Primary Care at [facility], Lonna Cobb, NP   1 year ago Depression with anxiety   [facility], Sarah L, PA-C             . metoprolol succinate (TOPROL-XL) 25 MG 24 hr tablet 90 tablet 0    Sig: Take 1 tablet (25 mg total) by mouth daily.     Cardiovascular:  Beta Blockers Failed - 05/29/2019 10:18 AM      Failed - Last BP in normal range    BP Readings from Last 1 Encounters:  01/23/19 (!) 156/88         Passed - Last Heart Rate in normal range    Pulse Readings from Last 1 Encounters:  01/23/19 87         Passed - Valid encounter  within last 6 months    Recent Outpatient Visits          2 months ago Multinodular thyroid   Primary [facility] at [facility], Lonna Cobb, NP   4 months ago Multinodular goiter   Primary Care at [facility], Lonna Cobb, NP   4 months ago Abnormality of gait   Primary Care at [facility], Lonna Cobb, NP   4 months ago Essential hypertension   Primary Care at [facility], Lonna Cobb, NP   1 year ago Depression with anxiety   [facility] at [facility], Dema Severin, PA-C

## 2019-06-03 MED ORDER — METOPROLOL SUCCINATE ER 25 MG PO TB24: 25.0000 mg | ORAL_TABLET | Freq: Every day | ORAL | 0 refills | Status: DC

## 2019-06-03 MED ORDER — HYDROCHLOROTHIAZIDE 12.5 MG PO TABS: 12.5000 mg | ORAL_TABLET | Freq: Every day | ORAL | 0 refills | Status: DC

## 2019-06-03 NOTE — Telephone Encounter (Signed)
Pt called stating that the pharmacy has not received this request. Please advise.  

## 2019-06-03 NOTE — Telephone Encounter (Signed)
Rx was sent as print  I have e-scribed the medication to pharmacy.

## 2019-06-16 DIAGNOSIS — E042 Nontoxic multinodular goiter: Secondary | ICD-10-CM | POA: Diagnosis not present

## 2019-06-16 DIAGNOSIS — K219 Gastro-esophageal reflux disease without esophagitis: Secondary | ICD-10-CM | POA: Diagnosis not present

## 2019-06-16 DIAGNOSIS — R69 Illness, unspecified: Secondary | ICD-10-CM | POA: Diagnosis not present

## 2019-06-16 DIAGNOSIS — F17200 Nicotine dependence, unspecified, uncomplicated: Secondary | ICD-10-CM | POA: Insufficient documentation

## 2019-06-18 DIAGNOSIS — E042 Nontoxic multinodular goiter: Secondary | ICD-10-CM | POA: Diagnosis not present

## 2019-06-18 DIAGNOSIS — E041 Nontoxic single thyroid nodule: Secondary | ICD-10-CM | POA: Diagnosis not present

## 2019-06-18 DIAGNOSIS — D440 Neoplasm of uncertain behavior of thyroid gland: Secondary | ICD-10-CM | POA: Diagnosis not present

## 2019-07-08 DIAGNOSIS — E042 Nontoxic multinodular goiter: Secondary | ICD-10-CM | POA: Diagnosis not present

## 2019-07-23 DIAGNOSIS — Z01812 Encounter for preprocedural laboratory examination: Secondary | ICD-10-CM | POA: Diagnosis not present

## 2019-07-23 DIAGNOSIS — Z20822 Contact with and (suspected) exposure to covid-19: Secondary | ICD-10-CM | POA: Diagnosis not present

## 2019-07-23 DIAGNOSIS — E042 Nontoxic multinodular goiter: Secondary | ICD-10-CM | POA: Diagnosis not present

## 2019-07-30 DIAGNOSIS — D34 Benign neoplasm of thyroid gland: Secondary | ICD-10-CM | POA: Diagnosis not present

## 2019-07-30 DIAGNOSIS — E042 Nontoxic multinodular goiter: Secondary | ICD-10-CM | POA: Diagnosis not present

## 2019-08-06 DIAGNOSIS — Z881 Allergy status to other antibiotic agents status: Secondary | ICD-10-CM | POA: Diagnosis not present

## 2019-08-06 DIAGNOSIS — Z880 Allergy status to penicillin: Secondary | ICD-10-CM | POA: Diagnosis not present

## 2019-08-06 DIAGNOSIS — Z882 Allergy status to sulfonamides status: Secondary | ICD-10-CM | POA: Diagnosis not present

## 2019-08-06 DIAGNOSIS — Z79899 Other long term (current) drug therapy: Secondary | ICD-10-CM | POA: Diagnosis not present

## 2019-08-06 DIAGNOSIS — Z885 Allergy status to narcotic agent status: Secondary | ICD-10-CM | POA: Diagnosis not present

## 2019-08-06 DIAGNOSIS — Z91040 Latex allergy status: Secondary | ICD-10-CM | POA: Diagnosis not present

## 2019-08-06 DIAGNOSIS — E042 Nontoxic multinodular goiter: Secondary | ICD-10-CM | POA: Diagnosis not present

## 2019-08-06 DIAGNOSIS — Z9089 Acquired absence of other organs: Secondary | ICD-10-CM | POA: Diagnosis not present

## 2019-08-12 DIAGNOSIS — Z4889 Encounter for other specified surgical aftercare: Secondary | ICD-10-CM | POA: Diagnosis not present

## 2019-08-12 DIAGNOSIS — E042 Nontoxic multinodular goiter: Secondary | ICD-10-CM | POA: Diagnosis not present

## 2019-08-26 ENCOUNTER — Other Ambulatory Visit: Payer: Self-pay | Admitting: Adult Health Nurse Practitioner

## 2019-08-26 DIAGNOSIS — E890 Postprocedural hypothyroidism: Secondary | ICD-10-CM | POA: Diagnosis not present

## 2019-08-26 NOTE — Telephone Encounter (Signed)
Copied from CRM [id number]. Topic: Quick Communication - Rx Refill/Question >> Aug 26, 2019  1:26 PM Sedonia Small, [place] A wrote: Medication:metoprolol succinate (TOPROL-XL) 25 MG 24 hr tablet (90 day supply)  Has the patient contacted their pharmacy? yes (Agent: If no, request that the patient contact the pharmacy for the refill.) (Agent: If yes, when and what did the pharmacy advise?)contact pcp  Preferred Pharmacy (with phone number or [phone] [place], [place] - [address]  Phone:  [phone] Fax:  [phone]     Agent: Please be advised that RX refills may take up to 3 business days. We ask that you follow-up with your pharmacy.

## 2019-08-26 NOTE — Telephone Encounter (Signed)
Refill sent.   Please schedule pt with a TOC appointment. Pt was Emma James.

## 2019-08-26 NOTE — Telephone Encounter (Signed)
Requested medication (s) are due for refill today:yes  Requested medication (s) are on the active medication list:yes  Last refill: 05/14/19 #90 0 refills  Future visit scheduled:No  Notes to clinic:  Last ordered by Royal Hawthorn NP.    Requested Prescriptions  Pending Prescriptions Disp Refills   metoprolol succinate (TOPROL-XL) 25 MG 24 hr tablet 90 tablet 0    Sig: Take 1 tablet (25 mg total) by mouth daily.      Cardiovascular:  Beta Blockers Failed - 08/26/2019  1:29 PM      Failed - Last BP in normal range    BP Readings from Last 1 Encounters:  01/23/19 (!) 156/88          Passed - Last Heart Rate in normal range    Pulse Readings from Last 1 Encounters:  01/23/19 87          Passed - Valid encounter within last 6 months    Recent Outpatient Visits           5 months ago Multinodular thyroid   Primary [facility] at [facility], Lonna Cobb, NP   7 months ago Multinodular goiter   Primary Care at [facility] Lonna Cobb, NP   7 months ago Abnormality of gait   Primary Care at [facility], Lonna Cobb, NP   7 months ago Essential hypertension   Primary Care at [facility], Lonna Cobb, NP   1 year ago Depression with anxiety   [facility] at [facility], Dema Severin, PA-C

## 2019-08-31 NOTE — Telephone Encounter (Signed)
Pt notified of PCP's resignation

## 2019-09-01 ENCOUNTER — Other Ambulatory Visit: Payer: Self-pay

## 2019-09-01 MED ORDER — METOPROLOL SUCCINATE ER 25 MG PO TB24: 25.0000 mg | ORAL_TABLET | Freq: Every day | ORAL | 0 refills | Status: DC

## 2019-09-01 NOTE — Telephone Encounter (Signed)
Pt is calling for refills patient has a TOC scheduled for June 1st available . Please give courtesy refill if possible  What is the name of the medication? metoprolol succinate (TOPROL-XL) 25 MG 24 hr tablet     Have you contacted your pharmacy to request a refill? y  Which pharmacy would you like this sent to?  [id number], [place] - [address]  [address], [place] [address]  Phone:  [phone] Fax:  [phone]  DEA #:  [id number]  Patient notified that their request is being sent to the clinical staff for review and that they should receive a call once it is complete. If they do not receive a call within 72 hours they can check with their pharmacy or our office.

## 2019-09-03 DIAGNOSIS — U071 COVID-19: Secondary | ICD-10-CM | POA: Diagnosis not present

## 2019-09-03 DIAGNOSIS — Z20828 Contact with and (suspected) exposure to other viral communicable diseases: Secondary | ICD-10-CM | POA: Diagnosis not present

## 2019-09-17 DIAGNOSIS — E890 Postprocedural hypothyroidism: Secondary | ICD-10-CM | POA: Diagnosis not present

## 2019-09-17 DIAGNOSIS — Z09 Encounter for follow-up examination after completed treatment for conditions other than malignant neoplasm: Secondary | ICD-10-CM | POA: Diagnosis not present

## 2019-10-06 ENCOUNTER — Other Ambulatory Visit: Payer: Self-pay

## 2019-10-06 ENCOUNTER — Encounter: Payer: Self-pay | Admitting: Registered Nurse

## 2019-10-06 ENCOUNTER — Ambulatory Visit (INDEPENDENT_AMBULATORY_CARE_PROVIDER_SITE_OTHER): Payer: 59 | Admitting: Registered Nurse

## 2019-10-06 VITALS — BP 154/92 | HR 89 | Temp 98.3°F | Resp 18 | Ht 64.0 in | Wt 139.0 lb

## 2019-10-06 DIAGNOSIS — I10 Essential (primary) hypertension: Secondary | ICD-10-CM | POA: Diagnosis not present

## 2019-10-06 DIAGNOSIS — Z23 Encounter for immunization: Secondary | ICD-10-CM

## 2019-10-06 DIAGNOSIS — Z7689 Persons encountering health services in other specified circumstances: Secondary | ICD-10-CM

## 2019-10-06 NOTE — Patient Instructions (Signed)
° ° ° °  If you have lab work done today you will be contacted with your lab results within the next 2 weeks.  If you have not heard from us then please contact us. The fastest way to get your results is to register for My Chart. ° ° °IF you received an x-ray today, you will receive an invoice from [facility]. Please contact [facility] at [phone] with questions or concerns regarding your invoice.  ° °IF you received labwork today, you will receive an invoice from [facility]. Please contact [facility] at [phone] with questions or concerns regarding your invoice.  ° °Our billing staff will not be able to assist you with questions regarding bills from these companies. ° °You will be contacted with the lab results as soon as they are available. The fastest way to get your results is to activate your My Chart account. Instructions are located on the last page of this paperwork. If you have not heard from us regarding the results in 2 weeks, please contact this office. °  ° ° ° °

## 2019-10-21 ENCOUNTER — Other Ambulatory Visit: Payer: Self-pay | Admitting: Registered Nurse

## 2019-10-21 MED ORDER — HYDROCHLOROTHIAZIDE 12.5 MG PO TABS: 12.5000 mg | ORAL_TABLET | Freq: Every day | ORAL | 0 refills | Status: DC

## 2019-10-21 NOTE — Telephone Encounter (Signed)
Copied from CRM [id number]. Topic: Quick Communication - Rx Refill/Question >> Oct 21, 2019 12:38 PM Mcneil, Ja-Kwan wrote: Medication: hydrochlorothiazide (HYDRODIURIL) 12.5 MG tablet  Has the patient contacted their pharmacy? yes   Preferred Pharmacy (with phone number or street name): [id number] [place], [place] - [address] Phone: [phone]  Fax: [phone]  Agent: Please be advised that RX refills may take up to 3 business days. We ask that you follow-up with your pharmacy.

## 2019-10-29 ENCOUNTER — Telehealth: Payer: Self-pay | Admitting: Registered Nurse

## 2019-10-29 DIAGNOSIS — E892 Postprocedural hypoparathyroidism: Secondary | ICD-10-CM | POA: Diagnosis not present

## 2019-10-29 DIAGNOSIS — E042 Nontoxic multinodular goiter: Secondary | ICD-10-CM | POA: Diagnosis not present

## 2019-10-29 DIAGNOSIS — E890 Postprocedural hypothyroidism: Secondary | ICD-10-CM | POA: Diagnosis not present

## 2019-10-29 NOTE — Telephone Encounter (Signed)
Pt thought you were placing order for DERM will you place referral or would you like to see pt for separate Ov

## 2019-10-29 NOTE — Telephone Encounter (Signed)
Patient was seen By [name] and stated that she thought that he had mentioned sending in a referral for dermatology/   Please advise

## 2019-10-30 ENCOUNTER — Other Ambulatory Visit: Payer: Self-pay

## 2019-10-30 ENCOUNTER — Ambulatory Visit
Admission: EM | Admit: 2019-10-30 | Discharge: 2019-10-30 | Disposition: A | Discharge status: Home / Self Care | Payer: 59

## 2019-10-30 DIAGNOSIS — E162 Hypoglycemia, unspecified: Secondary | ICD-10-CM | POA: Diagnosis not present

## 2019-10-30 HISTORY — DX: Disorder of thyroid, unspecified: E07.9

## 2019-10-30 LAB — POCT FASTING CBG KUC MANUAL ENTRY: POCT Glucose (KUC): 96 mg/dL (ref 70–99)

## 2019-10-30 NOTE — ED Triage Notes (Signed)
Pt states had follow up labs yesterday from a thyroidectomy in April. They called last night and said her glucose was 46 and to make an appt with PCP. States unable to see PCP until Tuesday.

## 2019-10-30 NOTE — ED Provider Notes (Signed)
[facility]    CSN: [id number] Arrival date & time: 10/30/19  1334      History   Chief Complaint Chief Complaint  Patient presents with  . Hypoglycemia    HPI Emma James is a 49 y.o. female.   49 year old female comes in for hypoglycemia. States had follow up labs yesterday s/p thyroidectomy, and received a call last night to tell her cbg was 46 and to make appointment with PCP. States has not felt well since thyroidectomy without significant changes during blood draw yesterday. Denies syncope. Had breakfast 2 hours ago.      Past Medical History:  Diagnosis Date  . Anxiety    Phreesia 10/04/2019  . Anxiety and depression   . Chest pain 01/15/2019  . Depression    [employer]reesia 10/04/2019  . EKG, abnormal 01/15/2019  . Essential hypertension 01/23/2019  . Hypertension    Phreesia 10/04/2019  . Hypertensive urgency 01/15/2019  . Thyroid disease     Patient Active Problem List   Diagnosis Date Noted  . Essential hypertension 01/23/2019  . Chest pain 01/15/2019  . EKG, abnormal 01/15/2019  . Multinodular thyroid 10/17/2017  . Depression with anxiety 01/25/2015  . 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 ABDOMINAL HYSTERECTOMY N/A    Phreesia 10/04/2019  . THYROIDECTOMY    . TOTAL ABDOMINAL HYSTERECTOMY  11/14/2015    OB History   No obstetric history on file.      Home Medications    Prior to Admission medications   Medication Sig Start Date End Date Taking? Authorizing Provider  calcium carbonate (TUMS - DOSED IN MG ELEMENTAL CALCIUM) 500 MG chewable tablet Chew 1 tablet by mouth daily.   Yes [provider]  calcitRIOL (ROCALTROL) 0.5 MCG capsule Take 0.5 mcg by mouth daily.    [provider]  escitalopram (LEXAPRO) 20 MG tablet Take 20 mg by mouth daily.    [provider]  hydrochlorothiazide (HYDRODIURIL) 12.5 MG tablet Take 1 tablet  (12.5 mg total) by mouth daily. 10/21/19   Janeece Agee, NP  levothyroxine (SYNTHROID) 125 MCG tablet Take 125 mcg by mouth daily before breakfast.    [provider]  magnesium gluconate (MAGONATE) 500 MG tablet Take 500 mg by mouth 2 (two) times daily.    [provider]  metoprolol succinate (TOPROL-XL) 25 MG 24 hr tablet Take 1 tablet (25 mg total) by mouth daily. 09/01/19   Janeece Agee, NP  Multiple Vitamins-Minerals (WOMENS 50+ MULTI VITAMIN/MIN PO) Take 1 tablet by mouth daily.    [provider]  traZODone (DESYREL) 150 MG tablet Take 0.5-1 tablets (75-150 mg total) by mouth at bedtime. 09/30/17   Valarie Cones, Dema Severin, PA-C    Family History Family History  Problem Relation Age of Onset  . Heart attack Mother   . Melanoma Father   . Multiple sclerosis Father   . Diabetes Father   . Melanoma Maternal Grandmother     Social History Social History   Tobacco Use  . Smoking status: Current Every Day Smoker    Packs/day: 1.00    Types: Cigarettes  . Smokeless tobacco: Never Used  Vaping Use  . Vaping Use: Never used  Substance Use Topics  . Alcohol use: Yes    Alcohol/week: 0.0 standard drinks    Comment: occ  . Drug use: Never     Allergies   Codeine, Latex, Sulfa antibiotics, and  Penicillins   Review of Systems Review of Systems  Reason unable to perform ROS: See HPI as above.     Physical Exam Triage Vital Signs ED Triage Vitals  Enc Vitals Group     BP 10/30/19 1358 (!) 152/94     Pulse Rate 10/30/19 1358 81     Resp 10/30/19 1358 16     Temp 10/30/19 1358 98.2 F (36.8 C)     Temp Source 10/30/19 1358 Oral     SpO2 10/30/19 1358 97 %     Weight --      Height --      Head Circumference --      Peak Flow --      Pain Score 10/30/19 1411 0     Pain Loc --      Pain Edu? --      Excl. in GC? --    No data found.  Updated Vital Signs BP (!) 152/94 (BP Location: Left Arm)   Pulse 81   Temp 98.2 F (36.8 C) (Oral)    Resp 16   LMP 09/06/2015 (Approximate)   SpO2 97%   Physical Exam Constitutional:      General: She is not in acute distress.    Appearance: Normal appearance. She is well-developed. She is not toxic-appearing or diaphoretic.  HENT:     Head: Normocephalic and atraumatic.  Eyes:     Conjunctiva/sclera: Conjunctivae normal.     Pupils: Pupils are equal, round, and reactive to light.  Pulmonary:     Effort: Pulmonary effort is normal. No respiratory distress.  Musculoskeletal:     Cervical back: Normal range of motion and neck supple.  Skin:    General: Skin is warm and dry.  Neurological:     Mental Status: She is alert and oriented to person, place, and time.      UC Treatments / Results  Labs (all labs ordered are listed, but only abnormal results are displayed) Labs Reviewed  POCT FASTING CBG [facility] MANUAL ENTRY    EKG   Radiology No results found.  Procedures Procedures (including critical care time)  Medications Ordered in UC Medications - No data to display  Initial Impression / Assessment and Plan / UC Course  I have reviewed the triage vital signs and the nursing notes.  Pertinent labs & imaging results that were available during my care of the patient were reviewed by me and considered in my medical decision making (see chart for details).    cbg 96. Discussed symptoms to monitor. Discussed carrying hard candy/fruit juice to have access if symptomatic. To follow up with PCP as scheduled for reevaluation. Return precautions given.  Final Clinical Impressions(s) / UC Diagnoses   Final diagnoses:  Hypoglycemia   ED Prescriptions    None     PDMP not reviewed this encounter.   Belinda Fisher, PA-C 10/30/19 1657

## 2019-11-02 ENCOUNTER — Other Ambulatory Visit: Payer: Self-pay | Admitting: Registered Nurse

## 2019-11-02 DIAGNOSIS — R21 Rash and other nonspecific skin eruption: Secondary | ICD-10-CM

## 2019-11-02 NOTE — Telephone Encounter (Signed)
Can place order now  Thank you  Jari Sportsman, NP

## 2019-11-02 NOTE — Progress Notes (Signed)
Pt needs referral to derm  Jari Sportsman, NP

## 2019-11-03 ENCOUNTER — Other Ambulatory Visit: Payer: Self-pay | Admitting: Registered Nurse

## 2019-11-03 ENCOUNTER — Other Ambulatory Visit: Payer: Self-pay

## 2019-11-03 ENCOUNTER — Encounter: Payer: Self-pay | Admitting: Registered Nurse

## 2019-11-03 ENCOUNTER — Ambulatory Visit (INDEPENDENT_AMBULATORY_CARE_PROVIDER_SITE_OTHER): Payer: 59 | Admitting: Registered Nurse

## 2019-11-03 VITALS — BP 145/91 | HR 84 | Temp 98.1°F | Resp 18 | Ht 64.0 in | Wt 140.4 lb

## 2019-11-03 DIAGNOSIS — E162 Hypoglycemia, unspecified: Secondary | ICD-10-CM

## 2019-11-03 LAB — GLUCOSE, POCT (MANUAL RESULT ENTRY): POC Glucose: 137 mg/dl — AB (ref 70–99)

## 2019-11-03 NOTE — Patient Instructions (Signed)
° ° ° °  If you have lab work done today you will be contacted with your lab results within the next 2 weeks.  If you have not heard from us then please contact us. The fastest way to get your results is to register for My Chart. ° ° °IF you received an x-ray today, you will receive an invoice from [facility]. Please contact [facility] at [phone] with questions or concerns regarding your invoice.  ° °IF you received labwork today, you will receive an invoice from [facility]. Please contact [facility] at [phone] with questions or concerns regarding your invoice.  ° °Our billing staff will not be able to assist you with questions regarding bills from these companies. ° °You will be contacted with the lab results as soon as they are available. The fastest way to get your results is to activate your My Chart account. Instructions are located on the last page of this paperwork. If you have not heard from us regarding the results in 2 weeks, please contact this office. °  ° ° ° °

## 2019-11-04 LAB — HEPATIC FUNCTION PANEL
ALT: 15 IU/L (ref 0–32)
AST: 19 IU/L (ref 0–40)
Albumin: 4.9 g/dL — ABNORMAL HIGH (ref 3.8–4.8)
Alkaline Phosphatase: 64 IU/L (ref 48–121)
Bilirubin Total: <0.2 mg/dL (ref 0.0–1.2)
Bilirubin, Direct: 0.06 mg/dL (ref 0.00–0.40)
Total Protein: 7.1 g/dL (ref 6.0–8.5)

## 2019-11-04 LAB — INSULIN AND C-PEPTIDE, SERUM
C-Peptide: 11.1 ng/mL — ABNORMAL HIGH (ref 1.1–4.4)
INSULIN: 60.2 u[IU]/mL — ABNORMAL HIGH (ref 2.6–24.9)

## 2019-11-04 LAB — LIPASE: Lipase: 38 U/L (ref 14–72)

## 2019-11-04 LAB — VITAMIN D 25 HYDROXY (VIT D DEFICIENCY, FRACTURES): Vit D, 25-Hydroxy: 36.2 ng/mL (ref 30.0–100.0)

## 2019-11-04 LAB — AMYLASE: Amylase: 63 U/L (ref 31–110)

## 2019-11-04 LAB — VITAMIN B12: Vitamin B-12: 456 pg/mL (ref 232–1245)

## 2019-11-05 ENCOUNTER — Telehealth: Payer: Self-pay | Admitting: Registered Nurse

## 2019-11-05 NOTE — Telephone Encounter (Signed)
Pt called in regard to med refill. Medication for anxiety and a glucose monitor to [facility]. Both are new prescriptions. Pt states she discussed with provider at her recent provider. Please advise

## 2019-11-06 NOTE — Telephone Encounter (Signed)
Pt is calling checking on the status of below medication and glucose meter

## 2019-11-06 NOTE — Telephone Encounter (Signed)
Patient states she is missing a prescription for Anxiety and also one for a glucose meter sent to her pharmacy. She doesn't know the prescription because it was recently discuss at last OV.  Please Advise.

## 2019-11-09 ENCOUNTER — Other Ambulatory Visit: Payer: Self-pay | Admitting: Registered Nurse

## 2019-11-09 DIAGNOSIS — E162 Hypoglycemia, unspecified: Secondary | ICD-10-CM

## 2019-11-09 DIAGNOSIS — F410 Panic disorder [episodic paroxysmal anxiety] without agoraphobia: Secondary | ICD-10-CM

## 2019-11-09 MED ORDER — BLOOD GLUCOSE METER KIT: PACK | 0 refills | Status: DC

## 2019-11-09 MED ORDER — ALPRAZOLAM 0.25 MG PO TBDP: 0.2500 mg | ORAL_TABLET | Freq: Every evening | ORAL | 0 refills | Status: DC | PRN

## 2019-11-09 NOTE — Telephone Encounter (Signed)
Sent,  Thanks,  Rich Ma Munoz, NP

## 2019-11-11 ENCOUNTER — Encounter: Payer: Self-pay | Admitting: Registered Nurse

## 2019-11-11 DIAGNOSIS — E162 Hypoglycemia, unspecified: Secondary | ICD-10-CM

## 2019-11-17 ENCOUNTER — Other Ambulatory Visit: Payer: Self-pay | Admitting: Registered Nurse

## 2019-11-24 DIAGNOSIS — E890 Postprocedural hypothyroidism: Secondary | ICD-10-CM | POA: Diagnosis not present

## 2019-11-24 DIAGNOSIS — E162 Hypoglycemia, unspecified: Secondary | ICD-10-CM | POA: Diagnosis not present

## 2019-11-24 DIAGNOSIS — Z720 Tobacco use: Secondary | ICD-10-CM | POA: Diagnosis not present

## 2019-12-02 DIAGNOSIS — E162 Hypoglycemia, unspecified: Secondary | ICD-10-CM | POA: Diagnosis not present

## 2019-12-30 ENCOUNTER — Ambulatory Visit: Payer: 59 | Admitting: Endocrinology

## 2019-12-31 ENCOUNTER — Encounter: Payer: Self-pay | Admitting: Registered Nurse

## 2019-12-31 NOTE — Progress Notes (Signed)
Established Patient Office Visit  Subjective:  Patient ID: Emma James, female    DOB: 16-Dec-1970  Age: 49 y.o. MRN: [id number]  CC:  Chief Complaint  Patient presents with  . Transitions Of Care    Establish care with a new provider . Patient states she has no other questions or concerns.    HPI Emma James presents for visit to est care. Formerly pt of [facility].  Histories reviewed. Pt feels like all aspects are under control at this time:  Anxiety and depression: lexapro 20mg  PO qd, alprazolam 0.25mg  PO qd PRN, trazodone 150mg  PO qhs PRN. Good effect. No AEs. Denies hi/si  Htn: taking metoprolol 25mg  XR po wd, hctz 12.5mg  PO qd. No CV symptoms. Feeling well. Hx of abnormal EKG - no new concerns  Thyroid: no ongoing issues. Has followed with endo in the past. Has had recent labs, reviewed with patient  No further concerns or complaints at this time.  Past Medical History:  Diagnosis Date  . Anxiety    [facility] 10/04/2019  . Anxiety and depression   . Chest pain 01/15/2019  . Depression    [facility] 10/04/2019  . EKG, abnormal 01/15/2019  . Essential hypertension 01/23/2019  . Hypertension    [facility] 10/04/2019  . Hypertensive urgency 01/15/2019  . Thyroid disease     Past Surgical History:  Procedure Laterality Date  . ABDOMINAL HYSTERECTOMY N/A    [facility] 10/04/2019  . THYROIDECTOMY    . TOTAL ABDOMINAL HYSTERECTOMY  11/14/2015    Family History  Problem Relation Age of Onset  . Heart attack Mother   . Melanoma Father   . Multiple sclerosis Father   . Diabetes Father   . Melanoma Maternal Grandmother     Social History   Socioeconomic History  . Marital status: Legally Separated    Spouse name: Not on file  . Number of children: 1  . Years of education: Not on file  . Highest education level: Not on file  Occupational History  . Not on file  Tobacco Use  . Smoking status: Current Every Day Smoker    Packs/day: 1.00    Types: Cigarettes  .  Smokeless tobacco: Never Used  Vaping Use  . Vaping Use: Never used  Substance and Sexual Activity  . Alcohol use: Yes    Alcohol/week: 0.0 standard drinks    Comment: occ  . Drug use: Never  . Sexual activity: Not Currently  Other Topics Concern  . Not on file  Social History Narrative   Lives alone   Works for [employer]   Children - 49 y/o   Grandchild - 1   Social Determinants of Health   Financial Resource Strain:   . Difficulty of Paying Living Expenses: Not on file  Food Insecurity:   . Worried About 10/06/2019 in the Last Year: Not on file  . Ran Out of Food in the Last Year: Not on file  Transportation Needs:   . Lack of Transportation (Medical): Not on file  . Lack of Transportation (Non-Medical): Not on file  Physical Activity:   . Days of Exercise per Week: Not on file  . Minutes of Exercise per Session: Not on file  Stress:   . Feeling of Stress : Not on file  Social Connections:   . Frequency of Communication with Friends and Family: Not on file  . Frequency of Social Gatherings with Friends and Family: Not on file  . Attends  Religious Services: Not on file  . Active Member of Clubs or Organizations: Not on file  . Attends Banker Meetings: Not on file  . Marital Status: Not on file  Intimate Partner Violence:   . Fear of Current or Ex-Partner: Not on file  . Emotionally Abused: Not on file  . Physically Abused: Not on file  . Sexually Abused: Not on file    Outpatient Medications Prior to Visit  Medication Sig Dispense Refill  . calcitRIOL (ROCALTROL) 0.5 MCG capsule Take 0.5 mcg by mouth daily.    Marland Kitchen escitalopram (LEXAPRO) 20 MG tablet Take 20 mg by mouth daily.    Marland Kitchen levothyroxine (SYNTHROID) 125 MCG tablet Take 125 mcg by mouth daily before breakfast.    . magnesium gluconate (MAGONATE) 500 MG tablet Take 500 mg by mouth 2 (two) times daily.    . Multiple Vitamins-Minerals (WOMENS 50+ MULTI VITAMIN/MIN PO) Take 1 tablet  by mouth daily.    . traZODone (DESYREL) 150 MG tablet Take 0.5-1 tablets (75-150 mg total) by mouth at bedtime. 90 tablet 3  . hydrochlorothiazide (HYDRODIURIL) 12.5 MG tablet Take 1 tablet (12.5 mg total) by mouth daily. 90 tablet 0  . metoprolol succinate (TOPROL-XL) 25 MG 24 hr tablet Take 1 tablet (25 mg total) by mouth daily. 90 tablet 0   No facility-administered medications prior to visit.    Allergies  Allergen Reactions  . Codeine Nausea And Vomiting  . Latex   . Sulfa Antibiotics Other (See Comments)    Hallucinations,   . Penicillins Diarrhea and Rash    Did it involve swelling of the face/tongue/throat, SOB, or low BP? N Did it involve sudden or severe rash/hives, skin peeling, or any reaction on the inside of your mouth or nose? Y Did you need to seek medical attention at a hospital or doctor's office? N When did it last happen?49 years old  If all above answers are "NO", may proceed with cephalosporin use.    ROS Review of Systems  Constitutional: Negative.   HENT: Negative.   Eyes: Negative.   Respiratory: Negative.   Cardiovascular: Negative.   Gastrointestinal: Negative.   Genitourinary: Negative.   Musculoskeletal: Negative.   Skin: Negative.   Neurological: Negative.   Psychiatric/Behavioral: Negative.       Objective:    Physical Exam Vitals and nursing note reviewed.  Constitutional:      General: She is not in acute distress.    Appearance: Normal appearance. She is normal weight. She is not ill-appearing, toxic-appearing or diaphoretic.  Cardiovascular:     Rate and Rhythm: Normal rate and regular rhythm.     Heart sounds: Normal heart sounds. No murmur heard.  No friction rub. No gallop.   Pulmonary:     Effort: Pulmonary effort is normal. No respiratory distress.     Breath sounds: Normal breath sounds. No stridor. No wheezing, rhonchi or rales.  Chest:     Chest wall: No tenderness.  Skin:    General: Skin is warm and dry.    Neurological:     General: No focal deficit present.     Mental Status: She is alert and oriented to person, place, and time. Mental status is at baseline.  Psychiatric:        Mood and Affect: Mood normal.        Behavior: Behavior normal.        Thought Content: Thought content normal.        Judgment: Judgment  normal.     BP (!) 154/92   Pulse 89   Temp 98.3 F (36.8 C) (Temporal)   Resp 18   Ht 5\' 4"  (1.626 m)   Wt 139 lb (63 kg)   LMP 09/06/2015 (Approximate)   SpO2 96%   BMI 23.86 kg/m  Wt Readings from Last 3 Encounters:  11/03/19 140 lb 6.4 oz (63.7 kg)  10/06/19 139 lb (63 kg)  01/23/19 147 lb 9.6 oz (67 kg)     Health Maintenance Due  Topic Date Due  . COVID-19 Vaccine (1) Never done  . INFLUENZA VACCINE  Never done  . PAP SMEAR-Modifier  11/08/2019    There are no preventive care reminders to display for this patient.  Lab Results  Component Value Date   TSH 0.920 01/23/2019   Lab Results  Component Value Date   WBC 8.6 01/15/2019   HGB 14.7 01/15/2019   HCT 45.8 01/15/2019   MCV 89.8 01/15/2019   PLT 263 01/15/2019   Lab Results  Component Value Date   NA 138 01/15/2019   K 3.7 01/15/2019   CO2 26 01/15/2019   GLUCOSE 99 01/15/2019   BUN 8 01/15/2019   CREATININE 0.67 01/15/2019   BILITOT <0.2 11/03/2019   ALKPHOS 64 11/03/2019   AST 19 11/03/2019   ALT 15 11/03/2019   PROT 7.1 11/03/2019   ALBUMIN 4.9 (H) 11/03/2019   CALCIUM 9.2 01/15/2019   ANIONGAP 9 01/15/2019   Lab Results  Component Value Date   CHOL 211 (H) 09/30/2017   Lab Results  Component Value Date   HDL 39 (L) 09/30/2017   Lab Results  Component Value Date   LDLCALC 138 (H) 09/30/2017   Lab Results  Component Value Date   TRIG 172 (H) 09/30/2017   Lab Results  Component Value Date   CHOLHDL 5.4 (H) 09/30/2017   Lab Results  Component Value Date   HGBA1C 5.3 09/30/2017      Assessment & Plan:   Problem List Items Addressed This Visit    None     Visit Diagnoses    Encounter to establish care    -  Primary   Need for Tdap vaccination          No orders of the defined types were placed in this encounter.   Follow-up: No follow-ups on file.   PLAN  Return q4mo for med check  Annual cpe and labs suggested  Patient encouraged to call clinic with any questions, comments, or concerns.  5mo, NP

## 2020-01-05 ENCOUNTER — Encounter: Payer: Self-pay | Admitting: Registered Nurse

## 2020-01-05 NOTE — Progress Notes (Signed)
Acute Office Visit  Subjective:    Patient ID: Emma James, female    DOB: Sep 05, 1970, 49 y.o.   MRN: [id number]  Chief Complaint  Patient presents with  . Anxiety    Patient is experiencing some anxiety issue that is causing her to have some panic attacks that wake her up out of her sleep. patient states she got her labs done at another doctor and her sugar levels was 46 on thursday     HPI Patient is in today for anxiety  Having panic attacks that wake her out of sleep. Trouble falling back asleep. Anxiety has been ongoing issue for her but worse lately. Not usually someone who has much sleep problems like apnea, sleep walking, or other disturbances beyond anxiety. It is affecting her day to day life.  Notes that she was at another provider and noted her sugars were at 46 - states she was feeling anxious, clammy, and had concern for these numbers - knows they are well below normal. Reports that she does not restrict her diet, does not have hx of eating disorder. Exercises somewhat but not to an extreme.   Past Medical History:  Diagnosis Date  . Anxiety    [facility] 10/04/2019  . Anxiety and depression   . Chest pain 01/15/2019  . Depression    [facility] 10/04/2019  . EKG, abnormal 01/15/2019  . Essential hypertension 01/23/2019  . Hypertension    [facility] 10/04/2019  . Hypertensive urgency 01/15/2019  . Thyroid disease     Past Surgical History:  Procedure Laterality Date  . ABDOMINAL HYSTERECTOMY N/A    [facility] 10/04/2019  . THYROIDECTOMY    . TOTAL ABDOMINAL HYSTERECTOMY  11/14/2015    Family History  Problem Relation Age of Onset  . Heart attack Mother   . Melanoma Father   . Multiple sclerosis Father   . Diabetes Father   . Melanoma Maternal Grandmother     Social History   Socioeconomic History  . Marital status: Legally Separated    Spouse name: Not on file  . Number of children: 1  . Years of education: Not on file  . Highest education level: Not on  file  Occupational History  . Not on file  Tobacco Use  . Smoking status: Current Every Day Smoker    Packs/day: 1.00    Types: Cigarettes  . Smokeless tobacco: Never Used  Vaping Use  . Vaping Use: Never used  Substance and Sexual Activity  . Alcohol use: Yes    Alcohol/week: 0.0 standard drinks    Comment: occ  . Drug use: Never  . Sexual activity: Not Currently  Other Topics Concern  . Not on file  Social History Narrative   Lives alone   Works for [employer]   Children - 48 y/o   Grandchild - 1   Social Determinants of Health   Financial Resource Strain:   . Difficulty of Paying Living Expenses: Not on file  Food Insecurity:   . Worried About Programme researcher, broadcasting/film/video in the Last Year: Not on file  . Ran Out of Food in the Last Year: Not on file  Transportation Needs:   . Lack of Transportation (Medical): Not on file  . Lack of Transportation (Non-Medical): Not on file  Physical Activity:   . Days of Exercise per Week: Not on file  . Minutes of Exercise per Session: Not on file  Stress:   . Feeling of Stress : Not on file  Social Connections:   . Frequency of Communication with Friends and Family: Not on file  . Frequency of Social Gatherings with Friends and Family: Not on file  . Attends Religious Services: Not on file  . Active Member of Clubs or Organizations: Not on file  . Attends Banker Meetings: Not on file  . Marital Status: Not on file  Intimate Partner Violence:   . Fear of Current or Ex-Partner: Not on file  . Emotionally Abused: Not on file  . Physically Abused: Not on file  . Sexually Abused: Not on file    Outpatient Medications Prior to Visit  Medication Sig Dispense Refill  . calcitRIOL (ROCALTROL) 0.5 MCG capsule Take 0.5 mcg by mouth daily.    . calcium carbonate (TUMS - DOSED IN MG ELEMENTAL CALCIUM) 500 MG chewable tablet Chew 1 tablet by mouth daily.    Marland Kitchen escitalopram (LEXAPRO) 20 MG tablet Take 20 mg by mouth daily.      Marland Kitchen levothyroxine (SYNTHROID) 125 MCG tablet Take 125 mcg by mouth daily before breakfast.    . magnesium gluconate (MAGONATE) 500 MG tablet Take 500 mg by mouth 2 (two) times daily.    . Multiple Vitamins-Minerals (WOMENS 50+ MULTI VITAMIN/MIN PO) Take 1 tablet by mouth daily.    . traZODone (DESYREL) 150 MG tablet Take 0.5-1 tablets (75-150 mg total) by mouth at bedtime. 90 tablet 3  . hydrochlorothiazide (HYDRODIURIL) 12.5 MG tablet Take 1 tablet (12.5 mg total) by mouth daily. 90 tablet 0  . metoprolol succinate (TOPROL-XL) 25 MG 24 hr tablet Take 1 tablet (25 mg total) by mouth daily. 90 tablet 0   No facility-administered medications prior to visit.    Allergies  Allergen Reactions  . Codeine Nausea And Vomiting  . Latex   . Sulfa Antibiotics Other (See Comments)    Hallucinations,   . Penicillins Diarrhea and Rash    Did it involve swelling of the face/tongue/throat, SOB, or low BP? N Did it involve sudden or severe rash/hives, skin peeling, or any reaction on the inside of your mouth or nose? Y Did you need to seek medical attention at a hospital or doctor's office? N When did it last happen?49 years old  If all above answers are "NO", may proceed with cephalosporin use.    Review of Systems Per hpi      Objective:    Physical Exam Vitals and nursing note reviewed.  Constitutional:      Appearance: Normal appearance.  HENT:     Head: Normocephalic and atraumatic.  Cardiovascular:     Rate and Rhythm: Normal rate and regular rhythm.     Heart sounds: Normal heart sounds. No murmur heard.  No friction rub. No gallop.   Pulmonary:     Effort: Pulmonary effort is normal. No respiratory distress.     Breath sounds: Normal breath sounds. No stridor. No wheezing, rhonchi or rales.  Chest:     Chest wall: No tenderness.  Skin:    General: Skin is warm and dry.  Neurological:     General: No focal deficit present.     Mental Status: She is alert and oriented to person,  place, and time.  Psychiatric:        Mood and Affect: Mood normal.        Behavior: Behavior normal.        Thought Content: Thought content normal.        Judgment: Judgment normal.  BP (!) 145/91   Pulse 84   Temp 98.1 F (36.7 C) (Temporal)   Resp 18   Ht 5\' 4"  (1.626 m)   Wt 140 lb 6.4 oz (63.7 kg)   LMP 09/06/2015 (Approximate)   SpO2 97%   BMI 24.10 kg/m  Wt Readings from Last 3 Encounters:  11/03/19 140 lb 6.4 oz (63.7 kg)  10/06/19 139 lb (63 kg)  01/23/19 147 lb 9.6 oz (67 kg)    Health Maintenance Due  Topic Date Due  . COVID-19 Vaccine (1) Never done  . INFLUENZA VACCINE  Never done  . PAP SMEAR-Modifier  11/08/2019    There are no preventive care reminders to display for this patient.   Lab Results  Component Value Date   TSH 0.920 01/23/2019   Lab Results  Component Value Date   WBC 8.6 01/15/2019   HGB 14.7 01/15/2019   HCT 45.8 01/15/2019   MCV 89.8 01/15/2019   PLT 263 01/15/2019   Lab Results  Component Value Date   NA 138 01/15/2019   K 3.7 01/15/2019   CO2 26 01/15/2019   GLUCOSE 99 01/15/2019   BUN 8 01/15/2019   CREATININE 0.67 01/15/2019   BILITOT <0.2 11/03/2019   ALKPHOS 64 11/03/2019   AST 19 11/03/2019   ALT 15 11/03/2019   PROT 7.1 11/03/2019   ALBUMIN 4.9 (H) 11/03/2019   CALCIUM 9.2 01/15/2019   ANIONGAP 9 01/15/2019   Lab Results  Component Value Date   CHOL 211 (H) 09/30/2017   Lab Results  Component Value Date   HDL 39 (L) 09/30/2017   Lab Results  Component Value Date   LDLCALC 138 (H) 09/30/2017   Lab Results  Component Value Date   TRIG 172 (H) 09/30/2017   Lab Results  Component Value Date   CHOLHDL 5.4 (H) 09/30/2017   Lab Results  Component Value Date   HGBA1C 5.3 09/30/2017       Assessment & Plan:   Problem List Items Addressed This Visit    None    Visit Diagnoses    Hypoglycemia    -  Primary   Relevant Orders   Glucose (CBG) (Completed)   Insulin and C-Peptide (Completed)    Lipase (Completed)   Amylase (Completed)   Vitamin B12 (Completed)   Vitamin D, 25-hydroxy (Completed)   Hepatic Function Panel (Completed)       No orders of the defined types were placed in this encounter.  PLAN  Concern that low glucose is negatively affecting anxiety. Will draw labs to investigate for inappropriate insulin levels  Will follow up as warranted  Return to discuss results and next steps for anxiety  Patient encouraged to call clinic with any questions, comments, or concerns.   10/02/2017, NP

## 2020-01-06 ENCOUNTER — Ambulatory Visit (INDEPENDENT_AMBULATORY_CARE_PROVIDER_SITE_OTHER): Payer: Self-pay | Admitting: Registered Nurse

## 2020-01-06 ENCOUNTER — Encounter: Payer: Self-pay | Admitting: Registered Nurse

## 2020-01-06 ENCOUNTER — Other Ambulatory Visit: Payer: Self-pay

## 2020-01-06 VITALS — BP 119/77 | HR 88 | Temp 98.0°F | Resp 18 | Ht 64.0 in | Wt 146.0 lb

## 2020-01-06 DIAGNOSIS — E162 Hypoglycemia, unspecified: Secondary | ICD-10-CM

## 2020-01-06 DIAGNOSIS — E042 Nontoxic multinodular goiter: Secondary | ICD-10-CM

## 2020-01-06 NOTE — Patient Instructions (Signed)
° ° ° °  If you have lab work done today you will be contacted with your lab results within the next 2 weeks.  If you have not heard from us then please contact us. The fastest way to get your results is to register for My Chart. ° ° °IF you received an x-ray today, you will receive an invoice from [facility]. Please contact [facility] at [phone] with questions or concerns regarding your invoice.  ° °IF you received labwork today, you will receive an invoice from [facility]. Please contact [facility] at [phone] with questions or concerns regarding your invoice.  ° °Our billing staff will not be able to assist you with questions regarding bills from these companies. ° °You will be contacted with the lab results as soon as they are available. The fastest way to get your results is to activate your My Chart account. Instructions are located on the last page of this paperwork. If you have not heard from us regarding the results in 2 weeks, please contact this office. °  ° ° ° °

## 2020-01-07 LAB — HEMOGLOBIN A1C
Est. average glucose Bld gHb Est-mCnc: 117 mg/dL
Hgb A1c MFr Bld: 5.7 % — ABNORMAL HIGH (ref 4.8–5.6)

## 2020-01-07 LAB — TSH: TSH: 2.64 u[IU]/mL (ref 0.450–4.500)

## 2020-01-13 ENCOUNTER — Other Ambulatory Visit: Payer: Self-pay | Admitting: Registered Nurse

## 2020-01-13 DIAGNOSIS — F410 Panic disorder [episodic paroxysmal anxiety] without agoraphobia: Secondary | ICD-10-CM

## 2020-01-13 NOTE — Telephone Encounter (Signed)
Patient is requesting a refill of the following medications: Requested Prescriptions   Pending Prescriptions Disp Refills   ALPRAZolam (NIRAVAM) 0.25 MG dissolvable tablet [Pharmacy Med Name: ALPRAZOLAM ODT 0.25 MG TAB] 30 tablet 0    Sig: TAKE 1 TABLET (0.25 MG TOTAL) BY MOUTH AT BEDTIME AS NEEDED FOR ANXIETY.    Date of patient request: 01/13/20 Last office visit: 01/06/20 Date of last refill: 11/09/19 Last refill amount: 30 Follow up time period per chart: NO F/U scheduled as of yet

## 2020-02-22 ENCOUNTER — Other Ambulatory Visit: Payer: Self-pay | Admitting: Registered Nurse

## 2020-03-06 ENCOUNTER — Encounter: Payer: Self-pay | Admitting: Registered Nurse

## 2020-03-06 NOTE — Progress Notes (Signed)
Established Patient Office Visit  Subjective:  Patient ID: Emma James, female    DOB: 07/01/1970  Age: 49 y.o. MRN: [id number]  CC:  Chief Complaint  Patient presents with   Follow-up    Patient is here for 3 month follow up. Also some Depression PHQ9=13   Medication Refill    Medication refill on all medications     HPI Sherman Lipuma presents for 3 mo follow up for anxiety and depression Improving Needs med refills Denies hi/si Has ups and downs but is satisfied with where she's at  Past Medical History:  Diagnosis Date   Anxiety    [employer] 10/04/2019   Anxiety and depression    Chest pain 01/15/2019   Depression    [employer] 10/04/2019   EKG, abnormal 01/15/2019   Essential hypertension 01/23/2019   Hypertension    [employer] 10/04/2019   Hypertensive urgency 01/15/2019   Thyroid disease     Past Surgical History:  Procedure Laterality Date   ABDOMINAL HYSTERECTOMY N/A    [employer] 10/04/2019   THYROIDECTOMY     TOTAL ABDOMINAL HYSTERECTOMY  11/14/2015    Family History  Problem Relation Age of Onset   Heart attack Mother    Melanoma Father    Multiple sclerosis Father    Diabetes Father    Melanoma Maternal Grandmother     Social History   Socioeconomic History   Marital status: Legally Separated    Spouse name: Not on file   Number of children: 1   Years of education: Not on file   Highest education level: Not on file  Occupational History   Not on file  Tobacco Use   Smoking status: Current Every Day Smoker    Packs/day: 1.00    Types: Cigarettes   Smokeless tobacco: Never Used  Vaping Use   Vaping Use: Never used  Substance and Sexual Activity   Alcohol use: Yes    Alcohol/week: 0.0 standard drinks    Comment: occ   Drug use: Never   Sexual activity: Not Currently  Other Topics Concern   Not on file  Social History Narrative   Lives alone   Works for [employer] - 49 y/o   Environmental health practitioner  - 1   Social Determinants of Health   Financial Resource Strain:    Difficulty of Paying Living Expenses: Not on file  Food Insecurity:    Worried About Charity fundraiser in the Last Year: Not on file   [employer] of Food in the Last Year: Not on file  Transportation Needs:    Lack of Transportation (Medical): Not on file   Lack of Transportation (Non-Medical): Not on file  Physical Activity:    Days of Exercise per Week: Not on file   Minutes of Exercise per Session: Not on file  Stress:    Feeling of Stress : Not on file  Social Connections:    Frequency of Communication with Friends and Family: Not on file   Frequency of Social Gatherings with Friends and Family: Not on file   Attends Religious Services: Not on file   Active Member of Clubs or Organizations: Not on file   Attends Archivist Meetings: Not on file   Marital Status: Not on file  Intimate Partner Violence:    Fear of Current or Ex-Partner: Not on file   Emotionally Abused: Not on file   Physically Abused: Not on file   Sexually Abused: Not on  file    Outpatient Medications Prior to Visit  Medication Sig Dispense Refill   blood glucose meter kit and supplies Dispense based on patient and insurance preference. Use up to four times daily as directed. (FOR ICD-10 E10.9, E11.9). 1 each 0   calcium carbonate (TUMS - DOSED IN MG ELEMENTAL CALCIUM) 500 MG chewable tablet Chew 1 tablet by mouth daily.     escitalopram (LEXAPRO) 20 MG tablet Take 20 mg by mouth daily.     hydrochlorothiazide (HYDRODIURIL) 12.5 MG tablet TAKE 1 TABLET BY MOUTH EVERY DAY 90 tablet 0   levothyroxine (SYNTHROID) 125 MCG tablet Take 125 mcg by mouth daily before breakfast.     Multiple Vitamins-Minerals (WOMENS 50+ MULTI VITAMIN/MIN PO) Take 1 tablet by mouth daily.     traZODone (DESYREL) 150 MG tablet Take 0.5-1 tablets (75-150 mg total) by mouth at bedtime. 90 tablet 3   ALPRAZolam (NIRAVAM) 0.25 MG  dissolvable tablet Take 1 tablet (0.25 mg total) by mouth at bedtime as needed for anxiety. 30 tablet 0   metoprolol succinate (TOPROL-XL) 25 MG 24 hr tablet TAKE 1 TABLET BY MOUTH EVERY DAY 90 tablet 0   calcitRIOL (ROCALTROL) 0.5 MCG capsule Take 0.5 mcg by mouth daily. (Patient not taking: Reported on 01/06/2020)     magnesium gluconate (MAGONATE) 500 MG tablet Take 500 mg by mouth 2 (two) times daily. (Patient not taking: Reported on 01/06/2020)     No facility-administered medications prior to visit.    Allergies  Allergen Reactions   Codeine Nausea And Vomiting   Latex    Sulfa Antibiotics Other (See Comments)    Hallucinations,    Penicillins Diarrhea and Rash    Did it involve swelling of the face/tongue/throat, SOB, or low BP? N Did it involve sudden or severe rash/hives, skin peeling, or any reaction on the inside of your mouth or nose? Y Did you need to seek medical attention at a hospital or doctor's office? N When did it last happen?49 years old  If all above answers are NO, may proceed with cephalosporin use.    ROS Review of Systems  Constitutional: Negative.   HENT: Negative.   Eyes: Negative.   Respiratory: Negative.   Cardiovascular: Negative.   Gastrointestinal: Negative.   Genitourinary: Negative.   Musculoskeletal: Negative.   Skin: Negative.   Neurological: Negative.   Psychiatric/Behavioral: Negative.       Objective:    Physical Exam Vitals and nursing note reviewed.  Constitutional:      General: She is not in acute distress.    Appearance: Normal appearance. She is normal weight. She is not ill-appearing, toxic-appearing or diaphoretic.  Cardiovascular:     Rate and Rhythm: Normal rate and regular rhythm.     Heart sounds: Normal heart sounds. No murmur heard.  No friction rub. No gallop.   Pulmonary:     Effort: Pulmonary effort is normal. No respiratory distress.     Breath sounds: Normal breath sounds. No stridor. No wheezing,  rhonchi or rales.  Chest:     Chest wall: No tenderness.  Skin:    General: Skin is warm and dry.  Neurological:     General: No focal deficit present.     Mental Status: She is alert and oriented to person, place, and time. Mental status is at baseline.  Psychiatric:        Mood and Affect: Mood normal.        Behavior: Behavior normal.  Thought Content: Thought content normal.        Judgment: Judgment normal.     BP 119/77    Pulse 88    Temp 98 F (36.7 C) (Temporal)    Resp 18    Ht _0  (1.626 m)    Wt 146 lb (66.2 kg)    LMP 09/06/2015 (Approximate)    SpO2 96%    BMI 25.06 kg/m  Wt Readings from Last 3 Encounters:  01/06/20 146 lb (66.2 kg)  11/03/19 140 lb 6.4 oz (63.7 kg)  10/06/19 139 lb (63 kg)     Health Maintenance Due  Topic Date Due   COVID-19 Vaccine (1) Never done   PAP SMEAR-Modifier  11/08/2019    There are no preventive care reminders to display for this patient.  Lab Results  Component Value Date   TSH 2.640 01/06/2020   Lab Results  Component Value Date   WBC 8.6 01/15/2019   HGB 14.7 01/15/2019   HCT 45.8 01/15/2019   MCV 89.8 01/15/2019   PLT 263 01/15/2019   Lab Results  Component Value Date   NA 138 01/15/2019   K 3.7 01/15/2019   CO2 26 01/15/2019   GLUCOSE 99 01/15/2019   BUN 8 01/15/2019   CREATININE 0.67 01/15/2019   BILITOT <0.2 11/03/2019   ALKPHOS 64 11/03/2019   AST 19 11/03/2019   ALT 15 11/03/2019   PROT 7.1 11/03/2019   ALBUMIN 4.9 (H) 11/03/2019   CALCIUM 9.2 01/15/2019   ANIONGAP 9 01/15/2019   Lab Results  Component Value Date   CHOL 211 (H) 09/30/2017   Lab Results  Component Value Date   HDL 39 (L) 09/30/2017   Lab Results  Component Value Date   LDLCALC 138 (H) 09/30/2017   Lab Results  Component Value Date   TRIG 172 (H) 09/30/2017   Lab Results  Component Value Date   CHOLHDL 5.4 (H) 09/30/2017   Lab Results  Component Value Date   HGBA1C 5.7 (H) 01/06/2020      Assessment &  Plan:   Problem List Items Addressed This Visit      Endocrine   Multinodular thyroid (Chronic)   Relevant Orders   TSH (Completed)    Other Visit Diagnoses    Hypoglycemia    -  Primary   Relevant Orders   Hemoglobin A1c (Completed)      No orders of the defined types were placed in this encounter.   Follow-up: No follow-ups on file.   PLAN  Repeat a1c and tsh given med hx to see if these are contributing to depression/anxiety  Refill meds  Return q3-6 mo  Patient encouraged to call clinic with any questions, comments, or concerns.  Maximiano Coss, NP

## 2020-04-03 ENCOUNTER — Other Ambulatory Visit: Payer: Self-pay | Admitting: Registered Nurse

## 2020-04-03 DIAGNOSIS — F410 Panic disorder [episodic paroxysmal anxiety] without agoraphobia: Secondary | ICD-10-CM

## 2020-04-03 NOTE — Telephone Encounter (Signed)
Requested medication (s) are due for refill today: no  Requested medication (s) are on the active medication list: yes  Last refill: 01/14/2020  Future visit scheduled: no  Notes to clinic: this refill cannot be delegated  Metoprolol was just filled on 02/22/2020 and is not due    Requested Prescriptions  Pending Prescriptions Disp Refills   metoprolol succinate (TOPROL-XL) 25 MG 24 hr tablet [Pharmacy Med Name: METOPROLOL SUCC ER 25 MG TAB] 90 tablet 0    Sig: TAKE 1 TABLET BY MOUTH EVERY DAY      Cardiovascular:  Beta Blockers Passed - 04/03/2020  8:24 PM      Passed - Last BP in normal range    BP Readings from Last 1 Encounters:  01/06/20 119/77          Passed - Last Heart Rate in normal range    Pulse Readings from Last 1 Encounters:  01/06/20 88          Passed - Valid encounter within last 6 months    Recent Outpatient Visits           2 months ago Hypoglycemia   [facility], Gerlene Burdock, NP   5 months ago Hypoglycemia   [facility], Gerlene Burdock, NP   6 months ago Encounter to establish care   [facility], Gerlene Burdock, NP   1 year ago Multinodular thyroid   [facility] at [facility], Lonna Cobb, NP   1 year ago Multinodular goiter   [facility] at [facility], Lonna Cobb, NP                  ALPRAZolam (NIRAVAM) 0.25 MG dissolvable tablet [Pharmacy Med Name: ALPRAZOLAM ODT 0.25 MG TAB] 30 tablet 0    Sig: TAKE 1 TABLET BY MOUTH AT BEDTIME AS NEEDED FOR ANXIETY.      Not Delegated - Psychiatry:  Anxiolytics/Hypnotics Failed - 04/03/2020  8:24 PM      Failed - This refill cannot be delegated      Failed - Urine Drug Screen completed in last 360 days      Passed - Valid encounter within last 6 months    Recent Outpatient Visits           2 months ago Hypoglycemia   [facility], Richard, NP   5 months ago Hypoglycemia   [facility], Gerlene Burdock, NP   6 months  ago Encounter to establish care   [facility], Gerlene Burdock, NP   1 year ago Multinodular thyroid   [facility] Lonna Cobb, NP   1 year ago Multinodular goiter   Primary Care at [facility], Lonna Cobb, NP                 Signed Prescriptions Disp Refills   hydrochlorothiazide (HYDRODIURIL) 12.5 MG tablet 90 tablet 0    Sig: TAKE 1 TABLET BY MOUTH EVERY DAY      Cardiovascular: Diuretics - Thiazide Failed - 04/03/2020  8:24 PM      Failed - Ca in normal range and within 360 days    Calcium  Date Value Ref Range Status  01/15/2019 9.2 8.9 - 10.3 mg/dL Final          Failed - Cr in normal range and within 360 days    Creatinine, Ser  Date Value Ref Range Status  01/15/2019 0.67 0.44 -  1.00 mg/dL Final          Failed - K in normal range and within 360 days    Potassium  Date Value Ref Range Status  01/15/2019 3.7 3.5 - 5.1 mmol/L Final          Failed - Na in normal range and within 360 days    Sodium  Date Value Ref Range Status  01/15/2019 138 135 - 145 mmol/L Final  09/30/2017 139 134 - 144 mmol/L Final          Passed - Last BP in normal range    BP Readings from Last 1 Encounters:  01/06/20 119/77          Passed - Valid encounter within last 6 months    Recent Outpatient Visits           2 months ago Hypoglycemia   [facility], Gerlene Burdock, NP   5 months ago Hypoglycemia   [facility], Gerlene Burdock, NP   6 months ago Encounter to establish care   [facility], Gerlene Burdock, NP   1 year ago Multinodular thyroid   Primary Care at [facility], Lonna Cobb, NP   1 year ago Multinodular goiter   Primary Care at [facility] Lonna Cobb, NP

## 2020-04-04 NOTE — Telephone Encounter (Signed)
Patient is requesting a refill of the following medications: Requested Prescriptions   Pending Prescriptions Disp Refills   metoprolol succinate (TOPROL-XL) 25 MG 24 hr tablet [Pharmacy Med Name: METOPROLOL SUCC ER 25 MG TAB] 90 tablet 0    Sig: TAKE 1 TABLET BY MOUTH EVERY DAY   ALPRAZolam (NIRAVAM) 0.25 MG dissolvable tablet [Pharmacy Med Name: ALPRAZOLAM ODT 0.25 MG TAB] 30 tablet 0    Sig: TAKE 1 TABLET BY MOUTH AT BEDTIME AS NEEDED FOR ANXIETY.   Signed Prescriptions Disp Refills   hydrochlorothiazide (HYDRODIURIL) 12.5 MG tablet 90 tablet 0    Sig: TAKE 1 TABLET BY MOUTH EVERY DAY    Authorizing Provider: Janeece Agee    Ordering User: HENDRICKS, CAYAREL L    Date of patient request: 04/04/2020 Last office visit: 01/06/2020 Date of last refill: 01/13/2020 Last refill amount: 30 Follow up time period per chart:  3-6 months

## 2020-05-30 ENCOUNTER — Other Ambulatory Visit: Payer: Self-pay | Admitting: Registered Nurse

## 2020-05-30 DIAGNOSIS — F410 Panic disorder [episodic paroxysmal anxiety] without agoraphobia: Secondary | ICD-10-CM

## 2020-05-30 NOTE — Telephone Encounter (Signed)
Requested medication (s) are due for refill today - yes  Requested medication (s) are on the active medication list -yes  Future visit scheduled -no  Last refill: 04/04/20  Notes to clinic: Request non delegated Rx  Requested Prescriptions  Pending Prescriptions Disp Refills   ALPRAZolam (NIRAVAM) 0.25 MG dissolvable tablet [Pharmacy Med Name: ALPRAZOLAM ODT 0.25 MG TAB] 30 tablet 0    Sig: TAKE 1 TABLET BY MOUTH AT BEDTIME AS NEEDED FOR ANXIETY.      Not Delegated - Psychiatry:  Anxiolytics/Hypnotics Failed - 05/30/2020 11:26 AM      Failed - This refill cannot be delegated      Failed - Urine Drug Screen completed in last 360 days      Passed - Valid encounter within last 6 months    Recent Outpatient Visits           4 months ago Hypoglycemia   [facility], Gerlene Burdock, NP   6 months ago Hypoglycemia   [facility], Gerlene Burdock, NP   7 months ago Encounter to establish care   [facility], Gerlene Burdock, NP   1 year ago Multinodular thyroid   Primary Care at [facility], Lonna Cobb, NP   1 year ago Multinodular goiter   Primary Care at [facility], Lonna Cobb, NP                    Requested Prescriptions  Pending Prescriptions Disp Refills   ALPRAZolam (NIRAVAM) 0.25 MG dissolvable tablet [Pharmacy Med Name: ALPRAZOLAM ODT 0.25 MG TAB] 30 tablet 0    Sig: TAKE 1 TABLET BY MOUTH AT BEDTIME AS NEEDED FOR ANXIETY.      Not Delegated - Psychiatry:  Anxiolytics/Hypnotics Failed - 05/30/2020 11:26 AM      Failed - This refill cannot be delegated      Failed - Urine Drug Screen completed in last 360 days      Passed - Valid encounter within last 6 months    Recent Outpatient Visits           4 months ago Hypoglycemia   [facility], Gerlene Burdock, NP   6 months ago Hypoglycemia   [facility], Gerlene Burdock, NP   7 months ago Encounter to establish care   [facility] at [facility],  Gerlene Burdock, NP   1 year ago Multinodular thyroid   Primary Care at [facility], Lonna Cobb, NP   1 year ago Multinodular goiter   Primary Care at [facility], Lonna Cobb, NP

## 2020-05-30 NOTE — Telephone Encounter (Signed)
Patient is requesting a refill of the following medications: Requested Prescriptions   Pending Prescriptions Disp Refills   ALPRAZolam (NIRAVAM) 0.25 MG dissolvable tablet [Pharmacy Med Name: ALPRAZOLAM ODT 0.25 MG TAB] 30 tablet 0    Sig: TAKE 1 TABLET BY MOUTH AT BEDTIME AS NEEDED FOR ANXIETY.    Date of patient request: 05/30/20 Last office visit: 01/06/20 Date of last refill: 04/04/20 Last refill amount: 30 + 0 Follow up time period per chart: None Yet

## 2020-06-01 ENCOUNTER — Telehealth: Payer: Self-pay

## 2020-06-01 ENCOUNTER — Other Ambulatory Visit: Payer: Self-pay | Admitting: Emergency Medicine

## 2020-06-01 DIAGNOSIS — E042 Nontoxic multinodular goiter: Secondary | ICD-10-CM

## 2020-06-01 MED ORDER — LEVOTHYROXINE SODIUM 125 MCG PO TABS: 125.0000 ug | ORAL_TABLET | Freq: Every day | ORAL | 1 refills | Status: DC

## 2020-06-01 NOTE — Telephone Encounter (Signed)
Please schedule a f/u visit for med refill. I have sent in a 30 day supply.

## 2020-06-01 NOTE — Telephone Encounter (Signed)
Not sure why rx was denied. It do not look like we sent in a rx for synthroid. I have sent in a 30 day supply

## 2020-06-01 NOTE — Telephone Encounter (Signed)
Pt. Called asking as to why her Synthroid medication had been denied. Pt. Asserts pharmacy has informed her that her Synthroid medication refill was denied by the practice

## 2020-06-02 NOTE — Telephone Encounter (Signed)
Spoke with patient and scheduled f/u appointment with provider for med refill on 3/8.

## 2020-06-14 ENCOUNTER — Ambulatory Visit (INDEPENDENT_AMBULATORY_CARE_PROVIDER_SITE_OTHER): Payer: Self-pay | Admitting: Registered Nurse

## 2020-06-14 ENCOUNTER — Other Ambulatory Visit: Payer: Self-pay

## 2020-06-14 ENCOUNTER — Encounter: Payer: Self-pay | Admitting: Registered Nurse

## 2020-06-14 VITALS — BP 127/75 | HR 89 | Temp 98.0°F | Resp 18 | Ht 64.0 in | Wt 145.6 lb

## 2020-06-14 DIAGNOSIS — E042 Nontoxic multinodular goiter: Secondary | ICD-10-CM

## 2020-06-14 DIAGNOSIS — F418 Other specified anxiety disorders: Secondary | ICD-10-CM

## 2020-06-14 DIAGNOSIS — F5105 Insomnia due to other mental disorder: Secondary | ICD-10-CM

## 2020-06-14 DIAGNOSIS — I10 Essential (primary) hypertension: Secondary | ICD-10-CM

## 2020-06-14 DIAGNOSIS — F410 Panic disorder [episodic paroxysmal anxiety] without agoraphobia: Secondary | ICD-10-CM

## 2020-06-14 MED ORDER — ESCITALOPRAM OXALATE 20 MG PO TABS: 20.0000 mg | ORAL_TABLET | Freq: Every day | ORAL | 3 refills | Status: DC

## 2020-06-14 MED ORDER — METOPROLOL SUCCINATE ER 25 MG PO TB24: 25.0000 mg | ORAL_TABLET | Freq: Every day | ORAL | 3 refills | Status: DC

## 2020-06-14 MED ORDER — ALPRAZOLAM 0.25 MG PO TBDP: ORAL_TABLET | ORAL | 0 refills | Status: DC

## 2020-06-14 MED ORDER — HYDROCHLOROTHIAZIDE 12.5 MG PO TABS
12.5000 mg | ORAL_TABLET | Freq: Every day | ORAL | 3 refills | Status: DC
Start: 2020-06-14 — End: 2023-08-08

## 2020-06-14 MED ORDER — TRAZODONE HCL 150 MG PO TABS: 75.0000 mg | ORAL_TABLET | Freq: Every day | ORAL | 3 refills | Status: DC

## 2020-06-14 NOTE — Patient Instructions (Signed)
° ° ° °  If you have lab work done today you will be contacted with your lab results within the next 2 weeks.  If you have not heard from us then please contact us. The fastest way to get your results is to register for My Chart. ° ° °IF you received an x-ray today, you will receive an invoice from [facility]. Please contact [facility] at [phone] with questions or concerns regarding your invoice.  ° °IF you received labwork today, you will receive an invoice from [facility]. Please contact [facility] at [phone] with questions or concerns regarding your invoice.  ° °Our billing staff will not be able to assist you with questions regarding bills from these companies. ° °You will be contacted with the lab results as soon as they are available. The fastest way to get your results is to activate your My Chart account. Instructions are located on the last page of this paperwork. If you have not heard from us regarding the results in 2 weeks, please contact this office. °  ° ° ° °

## 2020-06-24 ENCOUNTER — Other Ambulatory Visit: Payer: Self-pay | Admitting: Registered Nurse

## 2020-06-25 LAB — BASIC METABOLIC PANEL
BUN/Creatinine Ratio: 19 (ref 9–23)
BUN: 13 mg/dL (ref 6–24)
CO2: 27 mmol/L (ref 20–29)
Calcium: 8 mg/dL — ABNORMAL LOW (ref 8.7–10.2)
Chloride: 99 mmol/L (ref 96–106)
Creatinine, Ser: 0.69 mg/dL (ref 0.57–1.00)
Glucose: 164 mg/dL — ABNORMAL HIGH (ref 65–99)
Potassium: 3.5 mmol/L (ref 3.5–5.2)
Sodium: 142 mmol/L (ref 134–144)
eGFR: 108 mL/min/{1.73_m2} (ref >59)

## 2020-06-25 LAB — HEMOGLOBIN A1C
Est. average glucose Bld gHb Est-mCnc: 108 mg/dL
Hgb A1c MFr Bld: 5.4 % (ref 4.8–5.6)

## 2020-06-25 LAB — TSH: TSH: 1.44 u[IU]/mL (ref 0.450–4.500)

## 2020-07-21 NOTE — Telephone Encounter (Signed)
Opened in error

## 2020-08-31 ENCOUNTER — Other Ambulatory Visit: Payer: Self-pay | Admitting: Registered Nurse

## 2020-08-31 DIAGNOSIS — F410 Panic disorder [episodic paroxysmal anxiety] without agoraphobia: Secondary | ICD-10-CM

## 2020-09-01 NOTE — Telephone Encounter (Signed)
LFD 06/14/20 #30 with no refills LOV 06/14/20 NOV none

## 2020-10-17 ENCOUNTER — Other Ambulatory Visit: Payer: Self-pay | Admitting: Registered Nurse

## 2020-10-17 DIAGNOSIS — F410 Panic disorder [episodic paroxysmal anxiety] without agoraphobia: Secondary | ICD-10-CM

## 2020-11-28 ENCOUNTER — Other Ambulatory Visit: Payer: Self-pay | Admitting: Registered Nurse

## 2020-11-28 DIAGNOSIS — F410 Panic disorder [episodic paroxysmal anxiety] without agoraphobia: Secondary | ICD-10-CM

## 2020-12-28 ENCOUNTER — Other Ambulatory Visit: Payer: Self-pay | Admitting: Registered Nurse

## 2020-12-28 DIAGNOSIS — F410 Panic disorder [episodic paroxysmal anxiety] without agoraphobia: Secondary | ICD-10-CM

## 2020-12-28 NOTE — Telephone Encounter (Signed)
Patient is requesting a refill of the following medications: Requested Prescriptions   Pending Prescriptions Disp Refills   ALPRAZolam (NIRAVAM) 0.25 MG dissolvable tablet [Pharmacy Med Name: ALPRAZOLAM ODT 0.25 MG TAB] 30 tablet 0    Sig: TAKE 1 TABLET BY MOUTH AT BEDTIME AS NEEDED FOR ANXIETY    Date of patient request: 12/28/2020 Last office visit: 06/14/2020 Date of last refill: 11/29/2020 Last refill amount: 30 tablets  Follow up time period per chart: none

## 2021-01-11 ENCOUNTER — Other Ambulatory Visit: Payer: Self-pay

## 2021-01-11 ENCOUNTER — Ambulatory Visit
Admission: EM | Admit: 2021-01-11 | Discharge: 2021-01-11 | Disposition: A | Discharge status: Home / Self Care | Payer: Self-pay | Attending: Physician Assistant | Admitting: Physician Assistant

## 2021-01-11 DIAGNOSIS — Z885 Allergy status to narcotic agent status: Secondary | ICD-10-CM | POA: Insufficient documentation

## 2021-01-11 DIAGNOSIS — Z880 Allergy status to penicillin: Secondary | ICD-10-CM | POA: Insufficient documentation

## 2021-01-11 DIAGNOSIS — F17210 Nicotine dependence, cigarettes, uncomplicated: Secondary | ICD-10-CM | POA: Insufficient documentation

## 2021-01-11 DIAGNOSIS — Z20822 Contact with and (suspected) exposure to covid-19: Secondary | ICD-10-CM | POA: Insufficient documentation

## 2021-01-11 DIAGNOSIS — J069 Acute upper respiratory infection, unspecified: Secondary | ICD-10-CM | POA: Insufficient documentation

## 2021-01-11 DIAGNOSIS — Z79899 Other long term (current) drug therapy: Secondary | ICD-10-CM | POA: Insufficient documentation

## 2021-01-11 DIAGNOSIS — R110 Nausea: Secondary | ICD-10-CM | POA: Insufficient documentation

## 2021-01-11 DIAGNOSIS — Z882 Allergy status to sulfonamides status: Secondary | ICD-10-CM | POA: Insufficient documentation

## 2021-01-11 DIAGNOSIS — R197 Diarrhea, unspecified: Secondary | ICD-10-CM | POA: Insufficient documentation

## 2021-01-11 DIAGNOSIS — H9203 Otalgia, bilateral: Secondary | ICD-10-CM | POA: Insufficient documentation

## 2021-01-11 DIAGNOSIS — R051 Acute cough: Secondary | ICD-10-CM | POA: Insufficient documentation

## 2021-01-11 MED ORDER — FLUTICASONE PROPIONATE 50 MCG/ACT NA SUSP: 2.0000 | Freq: Every day | NASAL | 0 refills | Status: DC

## 2021-01-11 MED ORDER — PSEUDOEPH-BROMPHEN-DM 30-2-10 MG/5ML PO SYRP: 10.0000 mL | ORAL_SOLUTION | Freq: Four times a day (QID) | ORAL | 0 refills | Status: AC | PRN

## 2021-01-11 NOTE — ED Triage Notes (Signed)
Pt reports having bila ear pain, headache and sore throat x3 days. Also reports having diarrhea and feeling nauseous.  Neg home covid test yesterday and this morning.

## 2021-01-11 NOTE — Discharge Instructions (Addendum)

## 2021-01-11 NOTE — ED Provider Notes (Signed)
[facility]    CSN: [id number] Arrival date & time: 01/11/21  1412      History   Chief Complaint Chief Complaint  Patient presents with   Otalgia    HPI Emma James is a 50 y.o. female presenting for 3-day history of bilateral ear pain, headache, sore throat, sinus pressure and mild cough.  Denies any fevers.  Does report fatigue.  No runny nose, breathing difficulty.  She does also report nausea and diarrhea.  Husband has been sick with similar symptoms but seems to be better now.  Patient has had 2 negative at home COVID tests.  Has not been taking any OTC meds.  Medical history significant for hypertension, thyroid disease, anxiety and depression.  No other complaints.  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      Home Medications    Prior to Admission medications   Medication Sig Start Date End Date Taking? Authorizing Provider  brompheniramine-pseudoephedrine-DM 30-2-10 MG/5ML syrup Take 10 mLs by mouth 4 (four) times daily as needed for up to 7 days. 01/11/21 01/18/21 Yes Laurene Footman B, PA-C  fluticasone (FLONASE) 50 MCG/ACT nasal spray Place 2 sprays into both nostrils daily. 01/11/21  Yes  Laurene Footman B, PA-C  ALPRAZolam (NIRAVAM) 0.25 MG dissolvable tablet TAKE 1 TABLET BY MOUTH AT BEDTIME AS NEEDED FOR ANXIETY 12/29/20   Maximiano Coss, NP  blood glucose meter kit and supplies Dispense based on patient and insurance preference. Use up to four times daily as directed. (FOR ICD-10 E10.9, E11.9). 11/09/19   Maximiano Coss, NP  calcium carbonate (TUMS - DOSED IN MG ELEMENTAL CALCIUM) 500 MG chewable tablet Chew 1 tablet by mouth daily.    [provider]  escitalopram (LEXAPRO) 20 MG tablet Take 1 tablet (20 mg total) by mouth daily. 06/14/20   Maximiano Coss, NP  hydrochlorothiazide (HYDRODIURIL) 12.5 MG tablet Take 1 tablet (12.5 mg total) by mouth daily. 06/14/20   Maximiano Coss, NP  levothyroxine (SYNTHROID) 125 MCG tablet Take 1 tablet (125 mcg total) by mouth daily before breakfast. 06/01/20   Maximiano Coss, NP  metoprolol succinate (TOPROL-XL) 25 MG 24 hr tablet Take 1 tablet (25 mg total) by mouth daily. 06/14/20   Maximiano Coss, NP  Multiple Vitamins-Minerals (WOMENS 50+ MULTI VITAMIN/MIN PO) Take 1 tablet by mouth daily.    [provider]  traZODone (DESYREL) 150 MG tablet Take 0.5-1 tablets (75-150 mg total) by mouth at bedtime. 06/14/20   Maximiano Coss, NP    Family History Family History  Problem Relation Age of Onset   Heart attack Mother    Melanoma Father    Multiple  sclerosis Father    Diabetes Father    Melanoma Maternal Grandmother     Social History Social History   Tobacco Use   Smoking status: Every Day    Packs/day: 1.00    Types: Cigarettes   Smokeless tobacco: Never  Vaping Use   Vaping Use: Never used  Substance Use Topics   Alcohol use: Yes    Alcohol/week: 0.0 standard drinks    Comment: occ   Drug use: Never     Allergies   Codeine, Latex, Sulfa antibiotics, and Penicillins   Review of Systems Review of Systems  Constitutional:  Positive for fatigue. Negative for chills, diaphoresis and fever.  HENT:   Positive for ear pain, sinus pressure and sore throat. Negative for congestion, rhinorrhea and sinus pain.   Respiratory:  Positive for cough. Negative for shortness of breath.   Gastrointestinal:  Positive for diarrhea and nausea. Negative for abdominal pain and vomiting.  Musculoskeletal:  Negative for arthralgias and myalgias.  Skin:  Negative for rash.  Neurological:  Positive for headaches. Negative for weakness.  Hematological:  Negative for adenopathy.    Physical Exam Triage Vital Signs ED Triage Vitals [01/11/21 1419]  Enc Vitals Group     BP 135/87     Pulse Rate 82     Resp 18     Temp 99 F (37.2 C)     Temp Source Oral     SpO2 98 %     Weight 150 lb (68 kg)     Height '5\' 5"'  (1.651 m)     Head Circumference      Peak Flow      Pain Score 5     Pain Loc      Pain Edu?      Excl. in [place]?    No data found.  Updated Vital Signs BP 135/87   Pulse 82   Temp 99 F (37.2 C) (Oral)   Resp 18   Ht '5\' 5"'  (1.651 m)   Wt 150 lb (68 kg)   LMP 09/06/2015 (Approximate)   SpO2 98%   BMI 24.96 kg/m   Physical Exam Vitals and nursing note reviewed.  Constitutional:      General: She is not in acute distress.    Appearance: Normal appearance. She is not ill-appearing or toxic-appearing.  HENT:     Head: Normocephalic and atraumatic.     Right Ear: Ear canal and external ear normal. A middle ear effusion is present. Tympanic membrane is bulging.     Left Ear: Tympanic membrane, ear canal and external ear normal.     Nose: Mucosal edema and congestion present.     Mouth/Throat:     Mouth: Mucous membranes are moist.     Pharynx: Oropharynx is clear. Posterior oropharyngeal erythema (mild) present.  Eyes:     General: No scleral icterus.       Right eye: No discharge.        Left eye: No discharge.     Conjunctiva/sclera: Conjunctivae normal.  Cardiovascular:     Rate and Rhythm: Normal rate and regular rhythm.     Heart sounds: Normal heart sounds.  Pulmonary:      Effort: Pulmonary effort is normal. No respiratory distress.     Breath sounds: Normal breath sounds.  Musculoskeletal:     Cervical back: Neck supple.  Skin:    General: Skin is dry.  Neurological:     General: No focal deficit present.  Mental Status: She is alert. Mental status is at baseline.     Motor: No weakness.     Gait: Gait normal.  Psychiatric:        Mood and Affect: Mood normal.        Behavior: Behavior normal.        Thought Content: Thought content normal.     UC Treatments / Results  Labs (all labs ordered are listed, but only abnormal results are displayed) Labs Reviewed  SARS CORONAVIRUS 2 (TAT 6-24 HRS)    EKG   Radiology No results found.  Procedures Procedures (including critical care time)  Medications Ordered in UC Medications - No data to display  Initial Impression / Assessment and Plan / UC Course  I have reviewed the triage vital signs and the nursing notes.  Pertinent labs & imaging results that were available during my care of the patient were reviewed by me and considered in my medical decision making (see chart for details).  50 year old female presenting for 3-day history of sore throat, bilateral ear pain, sinus pressure, headaches, nausea, diarrhea and cough.  Vital signs stable and patient is overall well-appearing.  Exam significant for bulging and effusion of the right TM, nasal mucosal edema and congestion.  Also has mild posterior pharyngeal erythema.  Chest clear to auscultation heart regular rate and rhythm.  PCR COVID test obtained.  Current CDC guidelines, isolation protocol and ED precautions reviewed patient.  Advised patient symptoms consistent with viral illness.  Supportive care encouraged with increasing rest and fluids.  I have sent Bromfed-DM and fluticasone.  Reviewed return to ED precautions.  Work note given.  Final Clinical Impressions(s) / UC Diagnoses   Final diagnoses:  Viral upper respiratory  tract infection  Otalgia of both ears  Acute cough     Discharge Instructions      URI/COLD SYMPTOMS: Your exam today is consistent with a viral illness. Antibiotics are not indicated at this time. Use medications as directed, including cough syrup, nasal saline, and decongestants. Your symptoms should improve over the next few days and resolve within 7-10 days. Increase rest and fluids. F/u if symptoms worsen or predominate such as sore throat, ear pain, productive cough, shortness of breath, or if you develop high fevers or worsening fatigue over the next several days.    You have received COVID testing today either for positive exposure, concerning symptoms that could be related to COVID infection, screening purposes, or re-testing after confirmed positive.  Your test obtained today checks for active viral infection in the last 1-2 weeks. If your test is negative now, you can still test positive later. So, if you do develop symptoms you should either get re-tested and/or isolate x 5 days and then strict mask use x 5 days (unvaccinated) or mask use x 10 days (vaccinated). Please follow CDC guidelines.  While Rapid antigen tests come back in 15-20 minutes, send out PCR/molecular test results typically come back within 1-3 days. In the mean time, if you are symptomatic, assume this could be a positive test and treat/monitor yourself as if you do have COVID.   We will call with test results if positive. Please download the MyChart app and set up a profile to access test results.   If symptomatic, go home and rest. Push fluids. Take Tylenol as needed for discomfort. Gargle warm salt water. Throat lozenges. Take Mucinex DM or Robitussin for cough. Humidifier in bedroom to ease coughing. Warm showers. Also review the COVID handout  for more information.  COVID-19 INFECTION: The incubation period of COVID-19 is approximately 14 days after exposure, with most symptoms developing in roughly 4-5 days.  Symptoms may range in severity from mild to critically severe. Roughly 80% of those infected will have mild symptoms. People of any age may become infected with COVID-19 and have the ability to transmit the virus. The most common symptoms include: fever, fatigue, cough, body aches, headaches, sore throat, nasal congestion, shortness of breath, nausea, vomiting, diarrhea, changes in smell and/or taste.    COURSE OF ILLNESS Some patients may begin with mild disease which can progress quickly into critical symptoms. If your symptoms are worsening please call ahead to the Emergency Department and proceed there for further treatment. Recovery time appears to be roughly 1-2 weeks for mild symptoms and 3-6 weeks for severe disease.   GO IMMEDIATELY TO ER FOR FEVER YOU ARE UNABLE TO GET DOWN WITH TYLENOL, BREATHING PROBLEMS, CHEST PAIN, FATIGUE, LETHARGY, INABILITY TO EAT OR DRINK, ETC  QUARANTINE AND ISOLATION: To help decrease the spread of COVID-19 please remain isolated if you have COVID infection or are highly suspected to have COVID infection. This means -stay home and isolate to one room in the home if you live with others. Do not share a bed or bathroom with others while ill, sanitize and wipe down all countertops and keep common areas clean and disinfected. Stay home for 5 days. If you have no symptoms or your symptoms are resolving after 5 days, you can leave your house. Continue to wear a mask around others for 5 additional days. If you have been in close contact (within 6 feet) of someone diagnosed with COVID 19, you are advised to quarantine in your home for 14 days as symptoms can develop anywhere from 2-14 days after exposure to the virus. If you develop symptoms, you  must isolate.  Most current guidelines for COVID after exposure -unvaccinated: isolate 5 days and strict mask use x 5 days. Test on day 5 is possible -vaccinated: wear mask x 10 days if symptoms do not develop -You do not  necessarily need to be tested for COVID if you have + exposure and  develop symptoms. Just isolate at home x10 days from symptom onset During this global pandemic, CDC advises to practice social distancing, try to stay at least 70f away from others at all times. Wear a face covering. Wash and sanitize your hands regularly and avoid going anywhere that is not necessary.  KEEP IN MIND THAT THE COVID TEST IS NOT 100% ACCURATE AND YOU SHOULD STILL DO EVERYTHING TO PREVENT POTENTIAL SPREAD OF VIRUS TO OTHERS (WEAR MASK, WEAR GLOVES, WKlemmeHANDS AND SANITIZE REGULARLY). IF INITIAL TEST IS NEGATIVE, THIS MAY NOT MEAN YOU ARE DEFINITELY NEGATIVE. MOST ACCURATE TESTING IS DONE 5-7 DAYS AFTER EXPOSURE.   It is not advised by CDC to get re-tested after receiving a positive COVID test since you can still test positive for weeks to months after you have already cleared the virus.   *If you have not been vaccinated for COVID, I strongly suggest you consider getting vaccinated as long as there are no contraindications.       ED Prescriptions     Medication Sig Dispense Auth. Provider   brompheniramine-pseudoephedrine-DM 30-2-10 MG/5ML syrup Take 10 mLs by mouth 4 (four) times daily as needed for up to 7 days. 150 mL ELaurene FootmanB, PA-C   fluticasone (FLONASE) 50 MCG/ACT nasal spray Place 2 sprays into both nostrils  daily. 1 g Danton Clap, PA-C      PDMP not reviewed this encounter.   Danton Clap, PA-C 01/11/21 [phone]

## 2021-01-12 LAB — SARS CORONAVIRUS 2 (TAT 6-24 HRS): SARS Coronavirus 2: NEGATIVE

## 2021-01-29 ENCOUNTER — Other Ambulatory Visit: Payer: Self-pay | Admitting: Registered Nurse

## 2021-01-29 DIAGNOSIS — E042 Nontoxic multinodular goiter: Secondary | ICD-10-CM

## 2021-01-29 DIAGNOSIS — F410 Panic disorder [episodic paroxysmal anxiety] without agoraphobia: Secondary | ICD-10-CM

## 2021-02-20 NOTE — Progress Notes (Signed)
Established Patient Office Visit  Subjective:  Patient ID: Emma James, female    DOB: Dec 06, 1970  Age: 50 y.o. MRN: [id number]  CC:  Chief Complaint  Patient presents with   Medication Refill    Patient states she is here for a medication refill.    HPI Jett Kulzer presents for med refill  Anxiety and depression Ongoing. Taking lexapro 89m po qd, trazodone 25-523mpo qhs prn, alprazolam 0.2539mublingual bid prn. Good effect. Some breakthrough symptoms but has developed good coping mechanisms.  Hypertension: Patient Currently taking: hctz 12.5mg35m qd, metoprolol 25mg42mPo qd Good effect. No AEs. Denies CV symptoms including: chest pain, shob, doe, headache, visual changes, fatigue, claudication, and dependent edema.   Previous readings and labs: BP Readings from Last 3 Encounters:  01/11/21 135/87  06/14/20 127/75  01/06/20 119/77   Lab Results  Component Value Date   CREATININE 0.69 06/24/2020    No further concerns.   Past Medical History:  Diagnosis Date   Anxiety    Phreesia 10/04/2019   Anxiety and depression    Chest pain 01/15/2019   Depression    Phreesia 10/04/2019   EKG, abnormal 01/15/2019   Essential hypertension 01/23/2019   Hypertension    Phreesia 10/04/2019   Hypertensive urgency 01/15/2019   Thyroid disease     Past Surgical History:  Procedure Laterality Date   ABDOMINAL HYSTERECTOMY N/A    Phreesia 10/04/2019   THYROIDECTOMY     TOTAL ABDOMINAL HYSTERECTOMY  11/14/2015    Family History  Problem Relation Age of Onset   Heart attack Mother    Melanoma Father    Multiple sclerosis Father    Diabetes Father    Melanoma Maternal Grandmother     Social History   Socioeconomic History   Marital status: Legally Separated    Spouse name: Not on file   Number of children: 1   Years of education: Not on file   Highest education level: Not on file  Occupational History   Not on file  Tobacco Use   Smoking status: Every Day     Packs/day: 1.00    Types: Cigarettes   Smokeless tobacco: Never  Vaping Use   Vaping Use: Never used  Substance and Sexual Activity   Alcohol use: Yes    Alcohol/week: 0.0 standard drinks    Comment: occ   Drug use: Never   Sexual activity: Not Currently  Other Topics Concern   Not on file  Social History Narrative   Lives alone   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 Use up to four times daily as directed. (FOR ICD-10 E10.9, E11.9). 1 each 0   calcium carbonate (TUMS - DOSED IN MG ELEMENTAL CALCIUM) 500 MG chewable tablet Chew 1 tablet by mouth daily.     Multiple Vitamins-Minerals (WOMENS 50+ MULTI VITAMIN/MIN PO) Take 1 tablet by mouth daily.     ALPRAZolam (NIRAVAM) 0.25 MG dissolvable tablet TAKE 1 TABLET BY MOUTH AT  BEDTIME AS NEEDED FOR ANXIETY 30 tablet 0   escitalopram (LEXAPRO) 20 MG tablet Take 20 mg by mouth daily.     hydrochlorothiazide (HYDRODIURIL) 12.5 MG tablet TAKE 1 TABLET BY MOUTH EVERY DAY 90 tablet 0   levothyroxine (SYNTHROID) 125 MCG tablet Take 1 tablet (125 mcg total) by mouth daily before breakfast. 30 tablet 1   metoprolol succinate (TOPROL-XL) 25 MG 24 hr tablet TAKE 1 TABLET BY MOUTH EVERY DAY 90 tablet 0   traZODone (DESYREL) 150 MG tablet Take 0.5-1 tablets (75-150 mg total) by mouth at bedtime. 90 tablet 3   calcitRIOL (ROCALTROL) 0.5 MCG capsule Take 0.5 mcg by mouth daily. (Patient not taking: Reported on 01/06/2020)     magnesium gluconate (MAGONATE) 500 MG tablet Take 500 mg by mouth 2  (two) times daily. (Patient not taking: Reported on 01/06/2020)     No facility-administered medications prior to visit.    Allergies  Allergen Reactions   Codeine Nausea And Vomiting   Latex    Sulfa Antibiotics Other (See Comments)    Hallucinations,    Penicillins Diarrhea and Rash    Did it involve swelling of the face/tongue/throat, SOB, or low BP? N Did it involve sudden or severe rash/hives, skin peeling, or any reaction on the inside of your mouth or nose? Y Did you need to seek medical attention at a hospital or doctor's office? N When did it last happen?50 years old  If all above answers are "NO", may proceed with cephalosporin use.    ROS Review of Systems  Constitutional: Negative.   HENT: Negative.    Eyes: Negative.   Respiratory: Negative.    Cardiovascular: Negative.   Gastrointestinal: Negative.   Genitourinary: Negative.   Musculoskeletal: Negative.   Skin: Negative.   Neurological: Negative.   Psychiatric/Behavioral: Negative.    All other systems reviewed and are negative.    Objective:    Physical Exam Vitals and nursing note reviewed.  Constitutional:      General: She is not in acute distress.    Appearance: Normal appearance. She is normal weight. She is not ill-appearing, toxic-appearing or diaphoretic.  Cardiovascular:     Rate and Rhythm: Normal rate and regular rhythm.     Heart sounds: Normal heart sounds. No murmur heard.   No friction rub. No gallop.  Pulmonary:     Effort: Pulmonary effort is normal. No respiratory distress.     Breath sounds: Normal breath sounds. No stridor. No wheezing, rhonchi or rales.  Chest:     Chest wall: No tenderness.  Skin:    General: Skin is warm and dry.  Neurological:     General: No focal deficit present.     Mental Status: She is alert and oriented to person, place, and time. Mental status is at baseline.  Psychiatric:        Mood and Affect: Mood normal.        Behavior: Behavior normal.         Thought Content: Thought content normal.        Judgment: Judgment normal.    BP 127/75   Pulse 89   Temp 98 F (36.7 C) (Temporal)   Resp 18   Ht _0  (1.626 m)   Wt 145 lb 9.6 oz (66 kg)   LMP 09/06/2015 (Approximate)   SpO2 93%   BMI 24.99 kg/m  Wt Readings from Last 3 Encounters:  01/11/21 150 lb (68 kg)  06/14/20 145 lb 9.6 oz (  66 kg)  01/06/20 146 lb (66.2 kg)     Health Maintenance Due  Topic Date Due   Pneumococcal Vaccine 72-26 Years old (1 - PCV) Never done   Hepatitis C Screening  Never done   TETANUS/TDAP  Never done   PAP SMEAR-Modifier  11/08/2019   COVID-19 Vaccine (2 - Moderna series) 04/29/2020   INFLUENZA VACCINE  Never done    There are no preventive care reminders to display for this patient.  Lab Results  Component Value Date   TSH 1.440 06/24/2020   Lab Results  Component Value Date   WBC 8.6 01/15/2019   HGB 14.7 01/15/2019   HCT 45.8 01/15/2019   MCV 89.8 01/15/2019   PLT 263 01/15/2019   Lab Results  Component Value Date   NA 142 06/24/2020   K 3.5 06/24/2020   CO2 27 06/24/2020   GLUCOSE 164 (H) 06/24/2020   BUN 13 06/24/2020   CREATININE 0.69 06/24/2020   BILITOT <0.2 11/03/2019   ALKPHOS 64 11/03/2019   AST 19 11/03/2019   ALT 15 11/03/2019   PROT 7.1 11/03/2019   ALBUMIN 4.9 (H) 11/03/2019   CALCIUM 8.0 (L) 06/24/2020   ANIONGAP 9 01/15/2019   EGFR 108 06/24/2020   Lab Results  Component Value Date   CHOL 211 (H) 09/30/2017   Lab Results  Component Value Date   HDL 39 (L) 09/30/2017   Lab Results  Component Value Date   LDLCALC 138 (H) 09/30/2017   Lab Results  Component Value Date   TRIG 172 (H) 09/30/2017   Lab Results  Component Value Date   CHOLHDL 5.4 (H) 09/30/2017   Lab Results  Component Value Date   HGBA1C 5.4 06/24/2020      Assessment & Plan:   Problem List Items Addressed This Visit       Cardiovascular and Mediastinum   Essential hypertension (Chronic)   Relevant Medications    hydrochlorothiazide (HYDRODIURIL) 12.5 MG tablet   metoprolol succinate (TOPROL-XL) 25 MG 24 hr tablet   Other Relevant Orders   TSH (Completed)   Hemoglobin A1c (Completed)   Basic Metabolic Panel (Completed)     Endocrine   Multinodular thyroid (Chronic)   Relevant Medications   metoprolol succinate (TOPROL-XL) 25 MG 24 hr tablet   Other Relevant Orders   TSH (Completed)   Hemoglobin A1c (Completed)   Basic Metabolic Panel (Completed)     Other   Depression with anxiety - Primary   Relevant Medications   traZODone (DESYREL) 150 MG tablet   escitalopram (LEXAPRO) 20 MG tablet   Insomnia secondary to depression with anxiety   Relevant Medications   traZODone (DESYREL) 150 MG tablet   escitalopram (LEXAPRO) 20 MG tablet   Other Visit Diagnoses     Panic attacks       Relevant Medications   traZODone (DESYREL) 150 MG tablet   escitalopram (LEXAPRO) 20 MG tablet       Meds ordered this encounter  Medications   traZODone (DESYREL) 150 MG tablet    Sig: Take 0.5-1 tablets (75-150 mg total) by mouth at bedtime.    Dispense:  90 tablet    Refill:  3    Order Specific Question:   Supervising Provider    Answer:   Carlota Raspberry, JEFFREY R [[id number]]   escitalopram (LEXAPRO) 20 MG tablet    Sig: Take 1 tablet (20 mg total) by mouth daily.    Dispense:  90 tablet    Refill:  3  Order Specific Question:   Supervising Provider    Answer:   Carlota Raspberry, JEFFREY R [[id number]]   DISCONTD: ALPRAZolam (NIRAVAM) 0.25 MG dissolvable tablet    Sig: TAKE 1 TABLET BY MOUTH AT BEDTIME AS NEEDED FOR ANXIETY    Dispense:  30 tablet    Refill:  0    Not to exceed 5 additional fills before 10/01/2020 DX Code Needed  .    Order Specific Question:   Supervising Provider    Answer:   Carlota Raspberry, JEFFREY R [[id number]]   hydrochlorothiazide (HYDRODIURIL) 12.5 MG tablet    Sig: Take 1 tablet (12.5 mg total) by mouth daily.    Dispense:  90 tablet    Refill:  3    Order Specific Question:   Supervising Provider     Answer:   Carlota Raspberry, JEFFREY R [[id number]]   metoprolol succinate (TOPROL-XL) 25 MG 24 hr tablet    Sig: Take 1 tablet (25 mg total) by mouth daily.    Dispense:  90 tablet    Refill:  3    Order Specific Question:   Supervising Provider    Answer:   Carlota Raspberry, JEFFREY R [[id number]]    Follow-up: No follow-ups on file.   PLAN Refill meds as above Labs collected. Will follow up with the patient as warranted. Return q65moDiscussed risks around benzodiazepines. Pt voices understanding Patient encouraged to call clinic with any questions, comments, or concerns.  RMaximiano Coss NP

## 2021-02-23 ENCOUNTER — Other Ambulatory Visit: Payer: Self-pay | Admitting: Registered Nurse

## 2021-02-23 DIAGNOSIS — E042 Nontoxic multinodular goiter: Secondary | ICD-10-CM

## 2021-02-24 ENCOUNTER — Ambulatory Visit (INDEPENDENT_AMBULATORY_CARE_PROVIDER_SITE_OTHER): Payer: Self-pay | Admitting: Registered Nurse

## 2021-02-24 ENCOUNTER — Encounter: Payer: Self-pay | Admitting: Registered Nurse

## 2021-02-24 VITALS — BP 138/76 | HR 76 | Temp 98.3°F | Ht 65.0 in | Wt 145.2 lb

## 2021-02-24 DIAGNOSIS — Z13220 Encounter for screening for lipoid disorders: Secondary | ICD-10-CM

## 2021-02-24 DIAGNOSIS — E042 Nontoxic multinodular goiter: Secondary | ICD-10-CM

## 2021-02-24 DIAGNOSIS — I10 Essential (primary) hypertension: Secondary | ICD-10-CM

## 2021-02-24 DIAGNOSIS — F418 Other specified anxiety disorders: Secondary | ICD-10-CM

## 2021-02-24 DIAGNOSIS — F410 Panic disorder [episodic paroxysmal anxiety] without agoraphobia: Secondary | ICD-10-CM

## 2021-02-24 LAB — LIPID PANEL
Cholesterol: 225 mg/dL — ABNORMAL HIGH (ref 0–200)
HDL: 39.8 mg/dL (ref >39.00)
LDL Cholesterol: 148 mg/dL — ABNORMAL HIGH (ref 0–99)
NonHDL: 185.34
Total CHOL/HDL Ratio: 6
Triglycerides: 189 mg/dL — ABNORMAL HIGH (ref 0.0–149.0)
VLDL: 37.8 mg/dL (ref 0.0–40.0)

## 2021-02-24 LAB — BASIC METABOLIC PANEL
BUN: 14 mg/dL (ref 6–23)
CO2: 28 mEq/L (ref 19–32)
Calcium: 8.4 mg/dL (ref 8.4–10.5)
Chloride: 101 mEq/L (ref 96–112)
Creatinine, Ser: 0.75 mg/dL (ref 0.40–1.20)
GFR: 94.27 mL/min (ref >60.00)
Glucose, Bld: 109 mg/dL — ABNORMAL HIGH (ref 70–99)
Potassium: 3.8 mEq/L (ref 3.5–5.1)
Sodium: 140 mEq/L (ref 135–145)

## 2021-02-24 LAB — TSH: TSH: 2.56 u[IU]/mL (ref 0.35–5.50)

## 2021-02-24 MED ORDER — METOPROLOL SUCCINATE ER 25 MG PO TB24: 25.0000 mg | ORAL_TABLET | Freq: Every day | ORAL | 3 refills | Status: DC

## 2021-02-24 MED ORDER — LEVOTHYROXINE SODIUM 125 MCG PO TABS: 125.0000 ug | ORAL_TABLET | Freq: Every day | ORAL | 1 refills | Status: DC

## 2021-02-24 MED ORDER — ESCITALOPRAM OXALATE 20 MG PO TABS: 20.0000 mg | ORAL_TABLET | Freq: Every day | ORAL | 3 refills | Status: DC

## 2021-02-24 MED ORDER — ALPRAZOLAM 0.25 MG PO TBDP: 0.2500 mg | ORAL_TABLET | Freq: Every day | ORAL | 0 refills | Status: DC | PRN

## 2021-02-24 NOTE — Progress Notes (Signed)
Established Patient Office Visit  Subjective:  Patient ID: Emma James, female    DOB: [birth year]  Age: 50 y.o. MRN: [id number]  CC:  Chief Complaint  Patient presents with   Medication Refill    Refill/follow up on medications. No concerns     HPI Emma James presents for med refill  Anxiety and depression On lexapro 28m po qd, alprazolam 0.268msublingual po qd prn. Good effect. No AE. Aware of risks of use of high risk medication.   Hypothyroid Doing well on synthroid 12568mpo qd No symptoms suggesting thyroid dysfunction  Notable lifestyle updated - in stable permanent home with good job. Much more at ease with this as opposed to uncertainty of past few years.    Past Medical History:  Diagnosis Date   Anxiety    [employer] 10/04/2019   Anxiety and depression    Chest pain 01/15/2019   Depression    [employer] 10/04/2019   EKG, abnormal 01/15/2019   Essential hypertension 01/23/2019   Hypertension    [employer] 10/04/2019   Hypertensive urgency 01/15/2019   Thyroid disease     Past Surgical History:  Procedure Laterality Date   ABDOMINAL HYSTERECTOMY N/A    [employer] 10/04/2019   THYROIDECTOMY     TOTAL ABDOMINAL HYSTERECTOMY  11/14/2015    Family History  Problem Relation Age of Onset   Heart attack Mother    Melanoma Father    Multiple sclerosis Father    Diabetes Father    Melanoma Maternal Grandmother     Social History   Socioeconomic History   Marital status: Legally Separated    Spouse name: Not on file   Number of children: 1   Years of education: Not on file   Highest education level: Not on file  Occupational History   Not on file  Tobacco Use   Smoking status: Every Day    Packs/day: 1.00    Types: Cigarettes   Smokeless tobacco: Never  Vaping Use   Vaping Use: Never used  Substance and Sexual Activity   Alcohol use: Yes    Alcohol/week: 0.0 standard drinks    Comment: occ   Drug use: Never   Sexual activity: Not Currently   Other Topics Concern   Not on file  Social History Narrative   Lives alone   Works for [id number] 57[employer]   GraEnvironmental health practitioner1   Social Determinants of Health   Financial Resource Strain: Not on file  Food Insecurity: Not on file  Transportation Needs: Not on file  Physical Activity: Not on file  Stress: Not on file  Social Connections: Not on file  Intimate Partner Violence: Not on file    Outpatient Medications Prior to Visit  Medication Sig Dispense Refill   calcium carbonate (OSCAL) 1500 (600 Ca) MG TABS tablet Take 600 mg of elemental calcium by mouth daily at 2 am.     calcium carbonate (TUMS - DOSED IN MG ELEMENTAL CALCIUM) 500 MG chewable tablet Chew 1 tablet by mouth daily.     hydrochlorothiazide (HYDRODIURIL) 12.5 MG tablet Take 1 tablet (12.5 mg total) by mouth daily. 90 tablet 3   Multiple Vitamins-Minerals (WOMENS 50+ MULTI VITAMIN/MIN PO) Take 1 tablet by mouth daily.     traZODone (DESYREL) 150 MG tablet Take 0.5-1 tablets (75-150 mg total) by mouth at bedtime. 90 tablet 3   ALPRAZolam (NIRAVAM) 0.25 MG dissolvable tablet TAKE 1 TABLET BY MOUTH AT BEDTIME AS NEEDED  FOR ANXIETY 30 tablet 0   escitalopram (LEXAPRO) 20 MG tablet Take 1 tablet (20 mg total) by mouth daily. 90 tablet 3   levothyroxine (SYNTHROID) 125 MCG tablet TAKE 1 TABLET BY MOUTH DAILY BEFORE BREAKFAST. 30 tablet 0   metoprolol succinate (TOPROL-XL) 25 MG 24 hr tablet Take 1 tablet (25 mg total) by mouth daily. 90 tablet 3   blood glucose meter kit and supplies Dispense based on patient and insurance preference. Use up to four times daily as directed. (FOR ICD-10 E10.9, E11.9). (Patient not taking: Reported on 02/24/2021) 1 each 0   fluticasone (FLONASE) 50 MCG/ACT nasal spray Place 2 sprays into both nostrils daily. (Patient not taking: Reported on 02/24/2021) 1 g 0   No facility-administered medications prior to visit.    Allergies  Allergen Reactions   Codeine Nausea And  Vomiting   Latex    Sulfa Antibiotics Other (See Comments)    Hallucinations,    Penicillins Diarrhea and Rash    Did it involve swelling of the face/tongue/throat, SOB, or low BP? N Did it involve sudden or severe rash/hives, skin peeling, or any reaction on the inside of your mouth or nose? Y Did you need to seek medical attention at a hospital or doctor's office? N When did it last happen?50 years old  If all above answers are "NO", may proceed with cephalosporin use.    ROS Review of Systems  Constitutional: Negative.   HENT: Negative.    Eyes: Negative.   Respiratory: Negative.    Cardiovascular: Negative.   Gastrointestinal: Negative.   Genitourinary: Negative.   Musculoskeletal: Negative.   Skin: Negative.   Neurological: Negative.   Psychiatric/Behavioral: Negative.    All other systems reviewed and are negative.    Objective:    Physical Exam Vitals and nursing note reviewed.  Constitutional:      General: She is not in acute distress.    Appearance: Normal appearance. She is normal weight. She is not ill-appearing, toxic-appearing or diaphoretic.  Cardiovascular:     Rate and Rhythm: Normal rate and regular rhythm.     Heart sounds: Normal heart sounds. No murmur heard.   No friction rub. No gallop.  Pulmonary:     Effort: Pulmonary effort is normal. No respiratory distress.     Breath sounds: Normal breath sounds. No stridor. No wheezing, rhonchi or rales.  Chest:     Chest wall: No tenderness.  Skin:    General: Skin is warm and dry.  Neurological:     General: No focal deficit present.     Mental Status: She is alert and oriented to person, place, and time. Mental status is at baseline.  Psychiatric:        Mood and Affect: Mood normal.        Behavior: Behavior normal.        Thought Content: Thought content normal.        Judgment: Judgment normal.    BP 138/76 (BP Location: Right Arm, Patient Position: Sitting, Cuff Size: Normal)   Pulse 76    Temp 98.3 F (36.8 C) (Temporal)   Ht '5\' 5"'  (1.651 m)   Wt 145 lb 3.2 oz (65.9 kg)   LMP 09/06/2015 (Approximate)   SpO2 98%   BMI 24.16 kg/m  Wt Readings from Last 3 Encounters:  02/24/21 145 lb 3.2 oz (65.9 kg)  01/11/21 150 lb (68 kg)  06/14/20 145 lb 9.6 oz (66 kg)     Health Maintenance Due  Topic Date Due   Pneumococcal Vaccine 35-66 Years old (1 - PCV) Never done   Hepatitis C Screening  Never done   TETANUS/TDAP  Never done   PAP SMEAR-Modifier  11/08/2019    There are no preventive care reminders to display for this patient.  Lab Results  Component Value Date   TSH 1.440 06/24/2020   Lab Results  Component Value Date   WBC 8.6 01/15/2019   HGB 14.7 01/15/2019   HCT 45.8 01/15/2019   MCV 89.8 01/15/2019   PLT 263 01/15/2019   Lab Results  Component Value Date   NA 142 06/24/2020   K 3.5 06/24/2020   CO2 27 06/24/2020   GLUCOSE 164 (H) 06/24/2020   BUN 13 06/24/2020   CREATININE 0.69 06/24/2020   BILITOT <0.2 11/03/2019   ALKPHOS 64 11/03/2019   AST 19 11/03/2019   ALT 15 11/03/2019   PROT 7.1 11/03/2019   ALBUMIN 4.9 (H) 11/03/2019   CALCIUM 8.0 (L) 06/24/2020   ANIONGAP 9 01/15/2019   EGFR 108 06/24/2020   Lab Results  Component Value Date   CHOL 211 (H) 09/30/2017   Lab Results  Component Value Date   HDL 39 (L) 09/30/2017   Lab Results  Component Value Date   LDLCALC 138 (H) 09/30/2017   Lab Results  Component Value Date   TRIG 172 (H) 09/30/2017   Lab Results  Component Value Date   CHOLHDL 5.4 (H) 09/30/2017   Lab Results  Component Value Date   HGBA1C 5.4 06/24/2020      Assessment & Plan:   Problem List Items Addressed This Visit       Cardiovascular and Mediastinum   Essential hypertension (Chronic)   Relevant Medications   metoprolol succinate (TOPROL-XL) 25 MG 24 hr tablet   Other Relevant Orders   Basic Metabolic Panel (BMET)     Endocrine   Multinodular thyroid (Chronic)   Relevant Medications    levothyroxine (SYNTHROID) 125 MCG tablet   metoprolol succinate (TOPROL-XL) 25 MG 24 hr tablet   Other Relevant Orders   TSH     Other   Depression with anxiety   Relevant Medications   ALPRAZolam (NIRAVAM) 0.25 MG dissolvable tablet   escitalopram (LEXAPRO) 20 MG tablet   Other Visit Diagnoses     Lipid screening    -  Primary   Relevant Orders   Lipid panel   Panic attacks       Relevant Medications   ALPRAZolam (NIRAVAM) 0.25 MG dissolvable tablet   escitalopram (LEXAPRO) 20 MG tablet       Meds ordered this encounter  Medications   ALPRAZolam (NIRAVAM) 0.25 MG dissolvable tablet    Sig: Take 1 tablet (0.25 mg total) by mouth daily as needed for anxiety.    Dispense:  30 tablet    Refill:  0    Order Specific Question:   Supervising Provider    Answer:   Carlota Raspberry, JEFFREY R [[id number]]   levothyroxine (SYNTHROID) 125 MCG tablet    Sig: Take 1 tablet (125 mcg total) by mouth daily before breakfast.    Dispense:  90 tablet    Refill:  1    Will need OV before next refill    Order Specific Question:   Supervising Provider    Answer:   Carlota Raspberry, JEFFREY R [[id number]]   escitalopram (LEXAPRO) 20 MG tablet    Sig: Take 1 tablet (20 mg total) by mouth daily.    Dispense:  90 tablet  Refill:  3    Order Specific Question:   Supervising Provider    Answer:   Carlota Raspberry, JEFFREY R [[id number]]   metoprolol succinate (TOPROL-XL) 25 MG 24 hr tablet    Sig: Take 1 tablet (25 mg total) by mouth daily.    Dispense:  90 tablet    Refill:  3    Order Specific Question:   Supervising Provider    Answer:   Carlota Raspberry, JEFFREY R [[id number]]    Follow-up: Return in about 6 months (around 08/24/2021) for Thyroid, alprazolam.   PLAN Labs collected. Will follow up with the patient as warranted. Refill meds as above Return in 6 mo, sooner with concerns Patient encouraged to call clinic with any questions, comments, or concerns.  Maximiano Coss, NP

## 2021-02-24 NOTE — Patient Instructions (Signed)
Ms. Bouza -  Pleasure to see you  Let's touch base every 6 months - [place] policy for controlled substance refills -  TSH checked today, will let you know if we need to adjust levothyroxine  Thank you  Rich

## 2021-03-29 ENCOUNTER — Other Ambulatory Visit: Payer: Self-pay | Admitting: Registered Nurse

## 2021-03-29 DIAGNOSIS — F410 Panic disorder [episodic paroxysmal anxiety] without agoraphobia: Secondary | ICD-10-CM

## 2021-05-01 ENCOUNTER — Other Ambulatory Visit: Payer: Self-pay | Admitting: Registered Nurse

## 2021-05-01 DIAGNOSIS — F410 Panic disorder [episodic paroxysmal anxiety] without agoraphobia: Secondary | ICD-10-CM

## 2021-05-31 ENCOUNTER — Other Ambulatory Visit: Payer: Self-pay | Admitting: Registered Nurse

## 2021-05-31 DIAGNOSIS — E042 Nontoxic multinodular goiter: Secondary | ICD-10-CM

## 2021-05-31 DIAGNOSIS — F410 Panic disorder [episodic paroxysmal anxiety] without agoraphobia: Secondary | ICD-10-CM

## 2021-06-01 ENCOUNTER — Other Ambulatory Visit: Payer: Self-pay | Admitting: Registered Nurse

## 2021-06-01 DIAGNOSIS — E042 Nontoxic multinodular goiter: Secondary | ICD-10-CM

## 2021-07-03 ENCOUNTER — Other Ambulatory Visit: Payer: Self-pay | Admitting: Registered Nurse

## 2021-07-03 DIAGNOSIS — F410 Panic disorder [episodic paroxysmal anxiety] without agoraphobia: Secondary | ICD-10-CM

## 2021-07-03 DIAGNOSIS — F5105 Insomnia due to other mental disorder: Secondary | ICD-10-CM

## 2021-08-01 ENCOUNTER — Other Ambulatory Visit: Payer: Self-pay | Admitting: Registered Nurse

## 2021-08-01 DIAGNOSIS — F410 Panic disorder [episodic paroxysmal anxiety] without agoraphobia: Secondary | ICD-10-CM

## 2021-08-22 ENCOUNTER — Ambulatory Visit: Payer: Self-pay | Admitting: Registered Nurse

## 2021-08-24 ENCOUNTER — Ambulatory Visit: Payer: Self-pay | Admitting: Registered Nurse

## 2021-09-01 ENCOUNTER — Other Ambulatory Visit: Payer: Self-pay | Admitting: Registered Nurse

## 2021-09-01 DIAGNOSIS — F410 Panic disorder [episodic paroxysmal anxiety] without agoraphobia: Secondary | ICD-10-CM

## 2021-09-01 DIAGNOSIS — E042 Nontoxic multinodular goiter: Secondary | ICD-10-CM

## 2021-09-01 NOTE — Telephone Encounter (Signed)
Patient is requesting a refill of the following medications: Requested Prescriptions   Pending Prescriptions Disp Refills   levothyroxine (SYNTHROID) 125 MCG tablet [Pharmacy Med Name: LEVOTHYROXINE 125 MCG TABLET] 90 tablet 0    Sig: TAKE 1 TABLET BY MOUTH EVERY DAY BEFORE BREAKFAST   ALPRAZolam (NIRAVAM) 0.25 MG dissolvable tablet [Pharmacy Med Name: ALPRAZOLAM ODT 0.25 MG TAB] 30 tablet 0    Sig: TAKE 1 TABLET BY MOUTH EVERY DAY AS NEEDED FOR ANXIETY    Date of patient request: 09/01/2021 Last office visit: 02/24/2021 Date of last refill: 08/01/2021 Last refill amount: 30 tablets  Follow up time period per chart: none

## 2021-09-07 ENCOUNTER — Ambulatory Visit (INDEPENDENT_AMBULATORY_CARE_PROVIDER_SITE_OTHER): Payer: Self-pay | Admitting: Registered Nurse

## 2021-09-07 ENCOUNTER — Encounter: Payer: Self-pay | Admitting: Registered Nurse

## 2021-09-07 VITALS — BP 128/76 | HR 77 | Temp 98.0°F | Resp 18 | Ht 65.0 in | Wt 137.4 lb

## 2021-09-07 DIAGNOSIS — F410 Panic disorder [episodic paroxysmal anxiety] without agoraphobia: Secondary | ICD-10-CM

## 2021-09-07 DIAGNOSIS — F5105 Insomnia due to other mental disorder: Secondary | ICD-10-CM

## 2021-09-07 DIAGNOSIS — E890 Postprocedural hypothyroidism: Secondary | ICD-10-CM

## 2021-09-07 DIAGNOSIS — I10 Essential (primary) hypertension: Secondary | ICD-10-CM

## 2021-09-07 DIAGNOSIS — F418 Other specified anxiety disorders: Secondary | ICD-10-CM

## 2021-09-07 DIAGNOSIS — R221 Localized swelling, mass and lump, neck: Secondary | ICD-10-CM

## 2021-09-07 DIAGNOSIS — E042 Nontoxic multinodular goiter: Secondary | ICD-10-CM

## 2021-09-07 DIAGNOSIS — Z9889 Other specified postprocedural states: Secondary | ICD-10-CM | POA: Insufficient documentation

## 2021-09-07 DIAGNOSIS — Z9089 Acquired absence of other organs: Secondary | ICD-10-CM

## 2021-09-07 LAB — COMPREHENSIVE METABOLIC PANEL
ALT: 32 U/L (ref 0–35)
AST: 25 U/L (ref 0–37)
Albumin: 4.6 g/dL (ref 3.5–5.2)
Alkaline Phosphatase: 62 U/L (ref 39–117)
BUN: 13 mg/dL (ref 6–23)
CO2: 30 mEq/L (ref 19–32)
Calcium: 8.9 mg/dL (ref 8.4–10.5)
Chloride: 99 mEq/L (ref 96–112)
Creatinine, Ser: 0.63 mg/dL (ref 0.40–1.20)
GFR: 104.65 mL/min (ref >60.00)
Glucose, Bld: 82 mg/dL (ref 70–99)
Potassium: 4 mEq/L (ref 3.5–5.1)
Sodium: 139 mEq/L (ref 135–145)
Total Bilirubin: 0.4 mg/dL (ref 0.2–1.2)
Total Protein: 7.1 g/dL (ref 6.0–8.3)

## 2021-09-07 LAB — CBC WITH DIFFERENTIAL/PLATELET
Basophils Absolute: 0.1 10*3/uL (ref 0.0–0.1)
Basophils Relative: 1.2 % (ref 0.0–3.0)
Eosinophils Absolute: 0.6 10*3/uL (ref 0.0–0.7)
Eosinophils Relative: 7.9 % — ABNORMAL HIGH (ref 0.0–5.0)
HCT: 43.6 % (ref 36.0–46.0)
Hemoglobin: 14.7 g/dL (ref 12.0–15.0)
Lymphocytes Relative: 26.6 % (ref 12.0–46.0)
Lymphs Abs: 2 10*3/uL (ref 0.7–4.0)
MCHC: 33.6 g/dL (ref 30.0–36.0)
MCV: 88.2 fl (ref 78.0–100.0)
Monocytes Absolute: 0.5 10*3/uL (ref 0.1–1.0)
Monocytes Relative: 6.3 % (ref 3.0–12.0)
Neutro Abs: 4.3 10*3/uL (ref 1.4–7.7)
Neutrophils Relative %: 58 % (ref 43.0–77.0)
Platelets: 235 10*3/uL (ref 150.0–400.0)
RBC: 4.94 Mil/uL (ref 3.87–5.11)
RDW: 13.4 % (ref 11.5–15.5)
WBC: 7.4 10*3/uL (ref 4.0–10.5)

## 2021-09-07 LAB — TSH: TSH: 2.14 u[IU]/mL (ref 0.35–5.50)

## 2021-09-07 MED ORDER — LEVOTHYROXINE SODIUM 125 MCG PO TABS: 125.0000 ug | ORAL_TABLET | Freq: Every day | ORAL | 1 refills | Status: DC

## 2021-09-07 MED ORDER — ESCITALOPRAM OXALATE 20 MG PO TABS: 20.0000 mg | ORAL_TABLET | Freq: Every day | ORAL | 3 refills | Status: DC

## 2021-09-07 MED ORDER — METOPROLOL SUCCINATE ER 25 MG PO TB24: 25.0000 mg | ORAL_TABLET | Freq: Every day | ORAL | 3 refills | Status: DC

## 2021-09-07 MED ORDER — TRAZODONE HCL 150 MG PO TABS: 75.0000 mg | ORAL_TABLET | Freq: Every day | ORAL | 3 refills | Status: DC

## 2021-09-07 MED ORDER — ALPRAZOLAM 0.25 MG PO TBDP: ORAL_TABLET | ORAL | 0 refills | Status: DC

## 2021-09-07 NOTE — Assessment & Plan Note (Signed)
Clinically stable. Labs collected. Will follow up with the patient as warranted.  

## 2021-09-07 NOTE — Patient Instructions (Addendum)
Ms. Chrishell Kennison to see you  Call with concerns  Let's get an ultrasound of the neck. We can plan based on results  Labs today will be back this afternoon. I'll call if urgent concerns arise.  Thank you  Rich     If you have lab work done today you will be contacted with your lab results within the next 2 weeks.  If you have not heard from [place] then please contact us. The fastest way to get your results is to register for My Chart.   IF you received an x-ray today, you will receive an invoice from [facility]. Please contact [facility] at [phone] with questions or concerns regarding your invoice.   IF you received labwork today, you will receive an invoice from [facility]. Please contact [facility] at [phone] with questions or concerns regarding your invoice.   Our billing staff will not be able to assist you with questions regarding bills from these companies.  You will be contacted with the lab results as soon as they are available. The fastest way to get your results is to activate your My Chart account. Instructions are located on the last page of this paperwork. If you have not heard from [place] regarding the results in 2 weeks, please contact this office.

## 2021-09-07 NOTE — Assessment & Plan Note (Signed)
Continue alprazolam. pdmp consulted, no concerns.

## 2021-09-07 NOTE — Assessment & Plan Note (Signed)
Continue prn trazodone.  

## 2021-09-07 NOTE — Progress Notes (Signed)
Established Patient Office Visit  Subjective:  Patient ID: Emma James, female    DOB: June 13, 1970  Age: 51 y.o. MRN: [id number]  CC:  Chief Complaint  Patient presents with   Medication Refill    Patient states she is here for her 6 month follow up and medication refill.    HPI Emma James presents for med refill   Thyroid Stable. No symptoms.  Takes synthroid 179mg po qd Good effect Lab Results  Component Value Date   TSH 2.56 02/24/2021    Anxiety Alprazolam 0.274mpo qd prn, lexapro 2034mo qd, trazodone 75-150m56m qhs prn Good effect. No AE Unfortunately her father passed around 2 weeks ago. She feels she is coping well for now.  Neck Swelling Bilateral and midline anterior.  S/p radical thyroidectomy around 2 years ago for multinodular thyroid with thyroid cancer. Notes some mild dysphagia with tough foods. Thin liquids ok.  No sudden weight changes, nv, choking, GERD, fatigue, etc.  Otherwise, no concerns.   Outpatient Medications Prior to Visit  Medication Sig Dispense Refill   calcium carbonate (OSCAL) 1500 (600 Ca) MG TABS tablet Take 600 mg of elemental calcium by mouth daily at 2 am.     calcium carbonate (TUMS - DOSED IN MG ELEMENTAL CALCIUM) 500 MG chewable tablet Chew 1 tablet by mouth daily.     hydrochlorothiazide (HYDRODIURIL) 12.5 MG tablet Take 1 tablet (12.5 mg total) by mouth daily. 90 tablet 3   Multiple Vitamins-Minerals (WOMENS 50+ MULTI VITAMIN/MIN PO) Take 1 tablet by mouth daily.     ALPRAZolam (NIRAVAM) 0.25 MG dissolvable tablet TAKE 1 TABLET BY MOUTH EVERY DAY AS NEEDED FOR ANXIETY 30 tablet 0   escitalopram (LEXAPRO) 20 MG tablet Take 1 tablet (20 mg total) by mouth daily. 90 tablet 3   levothyroxine (SYNTHROID) 125 MCG tablet TAKE 1 TABLET BY MOUTH EVERY DAY BEFORE BREAKFAST 90 tablet 0   metoprolol succinate (TOPROL-XL) 25 MG 24 hr tablet Take 1 tablet (25 mg total) by mouth daily. 90 tablet 3   traZODone (DESYREL) 150 MG tablet  TAKE 0.5-1 TABLETS (75-150 MG TOTAL) BY MOUTH AT BEDTIME. 90 tablet 3   blood glucose meter kit and supplies Dispense based on patient and insurance preference. Use up to four times daily as directed. (FOR ICD-10 E10.9, E11.9). (Patient not taking: Reported on 02/24/2021) 1 each 0   fluticasone (FLONASE) 50 MCG/ACT nasal spray Place 2 sprays into both nostrils daily. (Patient not taking: Reported on 02/24/2021) 1 g 0   No facility-administered medications prior to visit.    Review of Systems  Constitutional: Negative.   HENT: Negative.    Eyes: Negative.   Respiratory: Negative.    Cardiovascular: Negative.   Gastrointestinal: Negative.   Genitourinary: Negative.   Musculoskeletal: Negative.   Skin: Negative.   Neurological: Negative.   Psychiatric/Behavioral: Negative.    All other systems reviewed and are negative.    Objective:     BP 128/76   Pulse 77   Temp 98 F (36.7 C) (Temporal)   Resp 18   Ht _0  (1.651 m)   Wt 137 lb 6.4 oz (62.3 kg)   LMP 09/06/2015 (Approximate)   SpO2 99%   BMI 22.86 kg/m   Wt Readings from Last 3 Encounters:  09/07/21 137 lb 6.4 oz (62.3 kg)  02/24/21 145 lb 3.2 oz (65.9 kg)  01/11/21 150 lb (68 kg)   Physical Exam Vitals and nursing note reviewed.  Constitutional:  General: She is not in acute distress.    Appearance: Normal appearance. She is normal weight. She is not ill-appearing, toxic-appearing or diaphoretic.  Neck:     Vascular: No carotid bruit.  Cardiovascular:     Rate and Rhythm: Normal rate and regular rhythm.     Heart sounds: Normal heart sounds. No murmur heard.   No friction rub. No gallop.  Pulmonary:     Effort: Pulmonary effort is normal. No respiratory distress.     Breath sounds: Normal breath sounds. No stridor. No wheezing, rhonchi or rales.  Chest:     Chest wall: No tenderness.  Musculoskeletal:     Cervical back: Normal range of motion and neck supple. Tenderness (bilateral along cervical  chain) present. No rigidity.  Lymphadenopathy:     Cervical: Cervical adenopathy (concerning regarding hx of thyroid ca, s/p radical thyroidectomy.) present.  Skin:    General: Skin is warm and dry.  Neurological:     General: No focal deficit present.     Mental Status: She is alert and oriented to person, place, and time. Mental status is at baseline.  Psychiatric:        Mood and Affect: Mood normal.        Behavior: Behavior normal.        Thought Content: Thought content normal.        Judgment: Judgment normal.    No results found for any visits on 09/07/21.    The 10-year ASCVD risk score (Arnett DK, et al., 2019) is: 7.8%    Assessment & Plan:   Problem List Items Addressed This Visit       Other   Depression with anxiety    Continue lexapro       Relevant Medications   ALPRAZolam (NIRAVAM) 0.25 MG dissolvable tablet   escitalopram (LEXAPRO) 20 MG tablet   traZODone (DESYREL) 150 MG tablet   Insomnia secondary to depression with anxiety    Continue prn trazodone       Relevant Medications   ALPRAZolam (NIRAVAM) 0.25 MG dissolvable tablet   escitalopram (LEXAPRO) 20 MG tablet   traZODone (DESYREL) 150 MG tablet   Panic attacks    Continue alprazolam. pdmp consulted, no concerns.        Relevant Medications   ALPRAZolam (NIRAVAM) 0.25 MG dissolvable tablet   escitalopram (LEXAPRO) 20 MG tablet   traZODone (DESYREL) 150 MG tablet   S/P complete thyroidectomy - Primary    Clinically stable. Labs collected. Will follow up with the patient as warranted.        Relevant Orders   US Soft Tissue Head/Neck (NON-THYROID)   Comprehensive metabolic panel   CBC with Differential/Platelet   TSH   Other Visit Diagnoses     Multinodular thyroid       Relevant Medications   levothyroxine (SYNTHROID) 125 MCG tablet   metoprolol succinate (TOPROL-XL) 25 MG 24 hr tablet   Essential hypertension       Relevant Medications   metoprolol succinate (TOPROL-XL)  25 MG 24 hr tablet   Neck swelling       Relevant Orders   US Soft Tissue Head/Neck (NON-THYROID)   Comprehensive metabolic panel   CBC with Differential/Platelet   TSH       Meds ordered this encounter  Medications   ALPRAZolam (NIRAVAM) 0.25 MG dissolvable tablet    Sig: TAKE 1 TABLET BY MOUTH EVERY DAY AS NEEDED FOR ANXIETY    Dispense:  30 tablet  Refill:  0    Not to exceed 5 additional fills before 01/28/2022    Order Specific Question:   Supervising Provider    Answer:   Carlota Raspberry, JEFFREY R [[id number]]   escitalopram (LEXAPRO) 20 MG tablet    Sig: Take 1 tablet (20 mg total) by mouth daily.    Dispense:  90 tablet    Refill:  3    Order Specific Question:   Supervising Provider    Answer:   Carlota Raspberry, JEFFREY R [[id number]]   levothyroxine (SYNTHROID) 125 MCG tablet    Sig: Take 1 tablet (125 mcg total) by mouth daily before breakfast.    Dispense:  90 tablet    Refill:  1    Order Specific Question:   Supervising Provider    Answer:   Carlota Raspberry, JEFFREY R [[id number]]   metoprolol succinate (TOPROL-XL) 25 MG 24 hr tablet    Sig: Take 1 tablet (25 mg total) by mouth daily.    Dispense:  90 tablet    Refill:  3    Order Specific Question:   Supervising Provider    Answer:   Carlota Raspberry, JEFFREY R [[id number]]   traZODone (DESYREL) 150 MG tablet    Sig: Take 0.5-1 tablets (75-150 mg total) by mouth at bedtime.    Dispense:  90 tablet    Refill:  3    Order Specific Question:   Supervising Provider    Answer:   Carlota Raspberry, JEFFREY R [[id number]]    Return in about 6 months (around 03/09/2022) for Chronic Conditions.   PLAN [place] of neck ordered Labs collected. Will follow up with the patient as warranted. See problem based charting Patient encouraged to call clinic with any questions, comments, or concerns.   Maximiano Coss, NP

## 2021-09-07 NOTE — Assessment & Plan Note (Signed)
Continue lexapro  ?

## 2021-09-08 ENCOUNTER — Ambulatory Visit
Admission: RE | Admit: 2021-09-08 | Discharge: 2021-09-08 | Disposition: A | Discharge status: Home / Self Care | Payer: Self-pay | Source: Ambulatory Visit | Attending: Registered Nurse | Admitting: Registered Nurse

## 2021-09-08 DIAGNOSIS — E890 Postprocedural hypothyroidism: Secondary | ICD-10-CM

## 2021-09-08 DIAGNOSIS — R221 Localized swelling, mass and lump, neck: Secondary | ICD-10-CM

## 2021-09-30 ENCOUNTER — Other Ambulatory Visit: Payer: Self-pay | Admitting: Registered Nurse

## 2021-09-30 DIAGNOSIS — F410 Panic disorder [episodic paroxysmal anxiety] without agoraphobia: Secondary | ICD-10-CM

## 2021-10-30 ENCOUNTER — Other Ambulatory Visit: Payer: Self-pay | Admitting: Registered Nurse

## 2021-10-30 DIAGNOSIS — F410 Panic disorder [episodic paroxysmal anxiety] without agoraphobia: Secondary | ICD-10-CM

## 2021-10-31 NOTE — Telephone Encounter (Signed)
Last visit: 09/07/21 Last refill: 10/02/21 for #30 no refills per pdmp.  No upcoming visit   Jari Sportsman, NP

## 2022-02-28 ENCOUNTER — Ambulatory Visit
Admission: EM | Admit: 2022-02-28 | Discharge: 2022-02-28 | Disposition: A | Discharge status: Home / Self Care | Payer: Self-pay | Attending: Family Medicine | Admitting: Family Medicine

## 2022-02-28 DIAGNOSIS — R070 Pain in throat: Secondary | ICD-10-CM | POA: Insufficient documentation

## 2022-02-28 DIAGNOSIS — R509 Fever, unspecified: Secondary | ICD-10-CM | POA: Insufficient documentation

## 2022-02-28 DIAGNOSIS — Z1152 Encounter for screening for COVID-19: Secondary | ICD-10-CM | POA: Insufficient documentation

## 2022-02-28 DIAGNOSIS — J069 Acute upper respiratory infection, unspecified: Secondary | ICD-10-CM | POA: Insufficient documentation

## 2022-02-28 LAB — RESP PANEL BY RT-PCR (FLU A&B, COVID) ARPGX2
Influenza A by PCR: NEGATIVE
Influenza B by PCR: NEGATIVE
SARS Coronavirus 2 by RT PCR: NEGATIVE

## 2022-02-28 LAB — POCT RAPID STREP A (OFFICE): Rapid Strep A Screen: NEGATIVE

## 2022-02-28 NOTE — ED Provider Notes (Signed)
[facility]    CSN: [id number] Arrival date & time: 02/28/22  1339      History   Chief Complaint Chief Complaint  Patient presents with   Fever   Generalized Body Aches    HPI Emma James is a 51 y.o. female.    Fever  Here for sore throat and cough and nasal congestion and ear pain.  Throat and ear pain are what is bothering her most.  She has had fever in the last days.  No vomiting or diarrhea.  She was exposed to her grandson who was ill with a cough.  No testing for specific diagnosis apparently was done  He is allergic to penicillin, codeine, and sulfa  Past Medical History:  Diagnosis Date   Anxiety    Phreesia 10/04/2019   Anxiety and depression    Chest pain 01/15/2019   Depression    Phreesia 10/04/2019   EKG, abnormal 01/15/2019   Essential hypertension 01/23/2019   Hypertension    Phreesia 10/04/2019   Hypertensive urgency 01/15/2019   Thyroid disease     Patient Active Problem List   Diagnosis Date Noted   Fever, unspecified 02/28/2022   Panic attacks 09/07/2021   S/P complete thyroidectomy 09/07/2021   Laryngopharyngeal reflux (LPR) 06/16/2019   Smoker 06/16/2019   Abnormal cervical Papanicolaou smear 02/18/2019   Hypertensive disorder 01/23/2019   Chest pain 01/15/2019   EKG, abnormal 01/15/2019   Multiple thyroid nodules 10/17/2017   Depression with anxiety 01/25/2015   Insomnia secondary to depression with anxiety 01/25/2015    Past Surgical History:  Procedure Laterality Date   ABDOMINAL HYSTERECTOMY N/A    Phreesia 10/04/2019   THYROIDECTOMY     TOTAL ABDOMINAL HYSTERECTOMY  11/14/2015    OB History   No obstetric history on file.      Home Medications    Prior to Admission medications   Medication Sig Start Date End Date Taking? Authorizing Provider  escitalopram (LEXAPRO) 20 MG tablet Take 1 tablet by mouth daily. 05/29/20  Yes [provider]  ALPRAZolam (NIRAVAM) 0.25 MG dissolvable tablet TAKE 1  TABLET BY MOUTH EVERY DAY AS NEEDED FOR ANXIETY 10/31/21   Maximiano Coss, NP  calcium carbonate (OSCAL) 1500 (600 Ca) MG TABS tablet Take 600 mg of elemental calcium by mouth daily at 2 am.    [provider]  calcium carbonate (TUMS - DOSED IN MG ELEMENTAL CALCIUM) 500 MG chewable tablet Chew 1 tablet by mouth daily.    [provider]  escitalopram (LEXAPRO) 20 MG tablet Take 1 tablet (20 mg total) by mouth daily. 09/07/21   Maximiano Coss, NP  hydrochlorothiazide (HYDRODIURIL) 12.5 MG tablet Take 1 tablet (12.5 mg total) by mouth daily. 06/14/20   Maximiano Coss, NP  levothyroxine (SYNTHROID) 125 MCG tablet Take 1 tablet (125 mcg total) by mouth daily before breakfast. 09/07/21   Maximiano Coss, NP  metoprolol succinate (TOPROL-XL) 25 MG 24 hr tablet Take 1 tablet (25 mg total) by mouth daily. 09/07/21   Maximiano Coss, NP  Multiple Vitamins-Minerals (WOMENS 50+ MULTI VITAMIN/MIN PO) Take 1 tablet by mouth daily.    [provider]  traZODone (DESYREL) 150 MG tablet Take 0.5-1 tablets (75-150 mg total) by mouth at bedtime. 09/07/21   Maximiano Coss, NP    Family History Family History  Problem Relation Age of Onset   Heart attack Mother    Melanoma Father    Multiple sclerosis Father    Diabetes Father  Melanoma Maternal Grandmother     Social History Social History   Tobacco Use   Smoking status: Every Day    Packs/day: 1.00    Types: Cigarettes   Smokeless tobacco: Never  Vaping Use   Vaping Use: Never used  Substance Use Topics   Alcohol use: Yes    Alcohol/week: 0.0 standard drinks of alcohol    Comment: occ   Drug use: Never     Allergies   Codeine, Sulfa antibiotics, Latex, and Penicillins   Review of Systems Review of Systems  Constitutional:  Positive for fever.     Physical Exam Triage Vital Signs ED Triage Vitals  Enc Vitals Group     BP 02/28/22 1706 (!) 153/95     Pulse Rate 02/28/22 1706 78     Resp 02/28/22 1706 18      Temp 02/28/22 1706 98.5 F (36.9 C)     Temp src --      SpO2 02/28/22 1706 96 %     Weight 02/28/22 1711 145 lb (65.8 kg)     Height 02/28/22 1711 _0  (1.651 m)     Head Circumference --      Peak Flow --      Pain Score 02/28/22 1710 6     Pain Loc --      Pain Edu? --      Excl. in [place]? --    No data found.  Updated Vital Signs BP (!) 153/95   Pulse 78   Temp 98.5 F (36.9 C)   Resp 18   Ht _1  (1.651 m)   Wt 65.8 kg   LMP 09/06/2015 (Approximate)   SpO2 96%   BMI 24.13 kg/m   Visual Acuity Right Eye Distance:   Left Eye Distance:   Bilateral Distance:    Right Eye Near:   Left Eye Near:    Bilateral Near:     Physical Exam Vitals reviewed.  Constitutional:      General: She is not in acute distress.    Appearance: She is not toxic-appearing.  HENT:     Right Ear: Tympanic membrane and ear canal normal.     Left Ear: Tympanic membrane and ear canal normal.     Nose: Congestion present.     Mouth/Throat:     Mouth: Mucous membranes are moist.     Comments: There is erythema of the posterior oropharynx. Eyes:     Extraocular Movements: Extraocular movements intact.     Conjunctiva/sclera: Conjunctivae normal.     Pupils: Pupils are equal, round, and reactive to light.  Cardiovascular:     Rate and Rhythm: Normal rate and regular rhythm.     Heart sounds: No murmur heard. Pulmonary:     Effort: Pulmonary effort is normal. No respiratory distress.     Breath sounds: No stridor. No wheezing, rhonchi or rales.  Musculoskeletal:     Cervical back: Neck supple.  Lymphadenopathy:     Cervical: No cervical adenopathy.  Skin:    Capillary Refill: Capillary refill takes less than 2 seconds.     Coloration: Skin is not jaundiced or pale.  Neurological:     General: No focal deficit present.     Mental Status: She is alert and oriented to person, place, and time.  Psychiatric:        Behavior: Behavior normal.      UC Treatments / Results   Labs (all labs ordered are listed, but only abnormal  results are displayed) Labs Reviewed  RESP PANEL BY RT-PCR (FLU A&B, COVID) ARPGX2  CULTURE, GROUP A STREP [facility]  POCT RAPID STREP A (OFFICE)    EKG   Radiology No results found.  Procedures Procedures (including critical care time)  Medications Ordered in UC Medications - No data to display  Initial Impression / Assessment and Plan / UC Course  I have reviewed the triage vital signs and the nursing notes.  Pertinent labs & imaging results that were available during my care of the patient were reviewed by me and considered in my medical decision making (see chart for details).     {The patient has been seen in Urgent Care in the last 3 years. :1  Her rapid strep is negative, so culture is sent and we will treat per protocol if positive.  Combo swab was sent, and she is a candidate for Tamiflu if she can get her results on MyChart tomorrow when we are closed.  I have written out a prescription for her to fill at the pharmacy if that is the case.   if she is positive for COVID she is a candidate for paxlovid unless there are drug interactions; last EGFR was 105 Here forFinal Clinical Impressions(s) / UC Diagnoses   Final diagnoses:  Throat pain   Discharge Instructions   None    ED Prescriptions   None    PDMP not reviewed this encounter.   Barrett Henle, MD 02/28/22 1736

## 2022-02-28 NOTE — ED Triage Notes (Signed)
Patient to Urgent Care with complaints of fevers, generalized body aches, bilateral ear and throat pain. Symptoms started two days ago. Cough is dry and at times productive with clear phlegm.   Max temp 101 today and yesterday.  Has been taking cough medication at night and ibuprofen.

## 2022-02-28 NOTE — Discharge Instructions (Signed)
Your strep test is negative.  Culture of the throat will be sent, and staff will notify you if that is in turn positive.   You have been swabbed for COVID/flu/RSV, and the test will result in the next 24 hours. Our staff will call you if positive. If the COVID test is positive, you should quarantine for 5 days from the start of your symptoms

## 2022-03-03 LAB — CULTURE, GROUP A STREP (THRC)

## 2022-03-13 ENCOUNTER — Ambulatory Visit: Payer: Self-pay | Admitting: Registered Nurse

## 2022-07-27 IMAGING — US US THYROID
1 series · 14 of 25 positions shown · non-contrast
Comparison: 10/14/2017

CLINICAL DATA: 50-year-old female status post total thyroidectomy
presenting with neck swelling and left neck pain for 2-3 months.

EXAM:
THYROID ULTRASOUND
TECHNIQUE: Ultrasound examination of the thyroid gland and adjacent soft
tissues was performed.

[Series 1: us thyroid · 0.06mm/px · 14 of 41 slices shown]
[im 1/41]
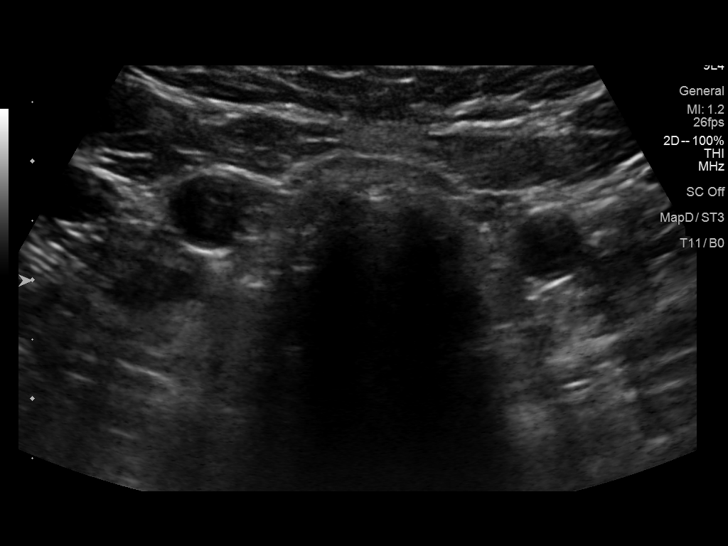
[im 4/41]
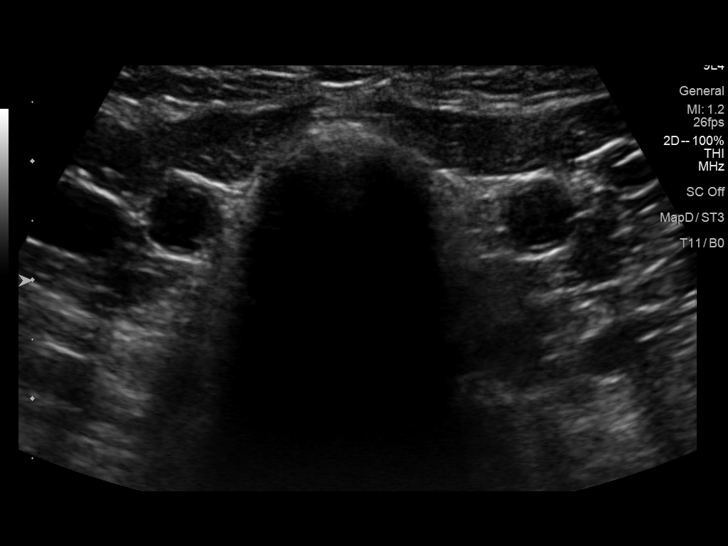
[im 7/41]
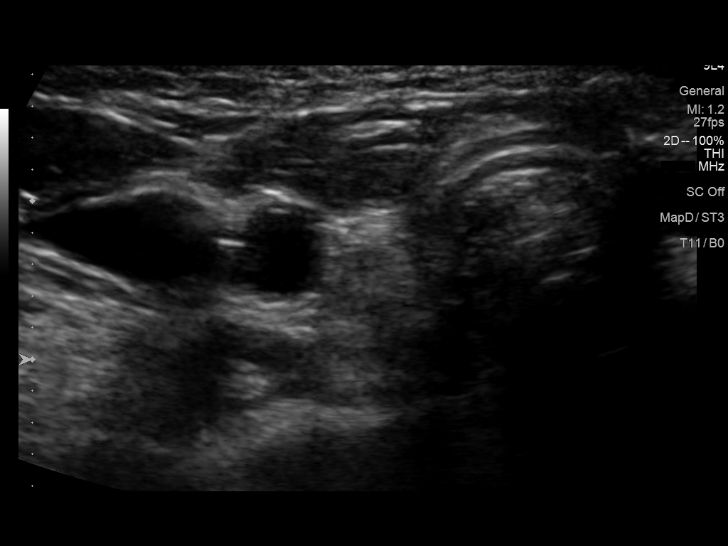
[im 11/41]
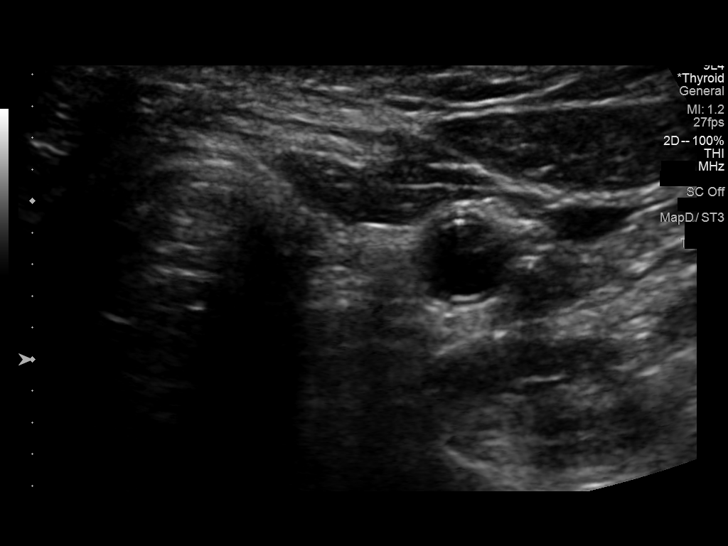
[im 14/41]
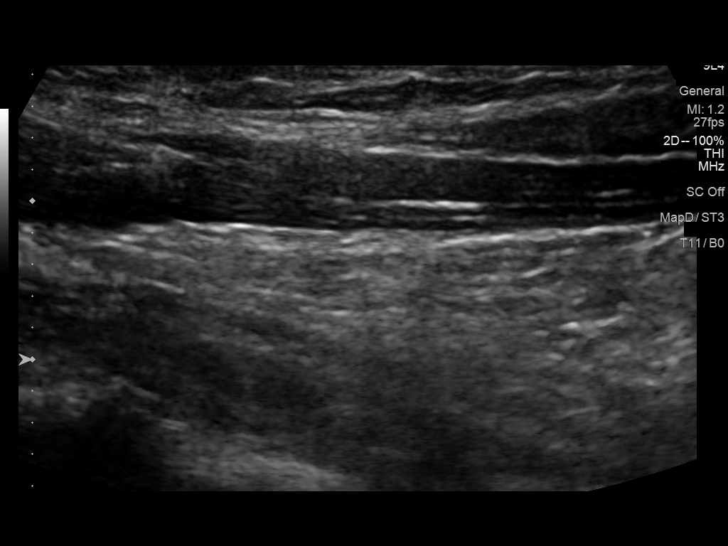
[im 16/41]
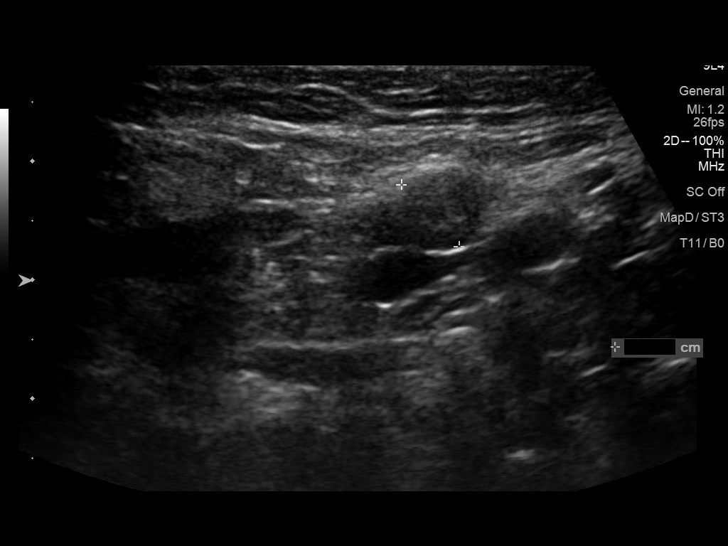
[im 19/41]
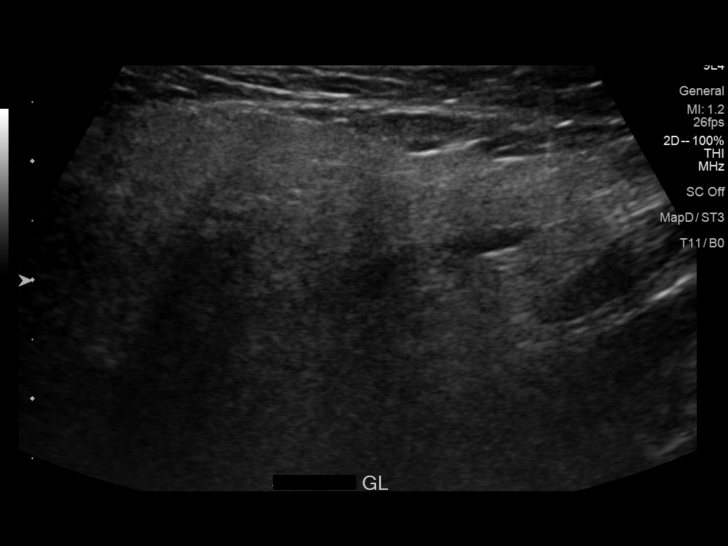
[im 22/41]
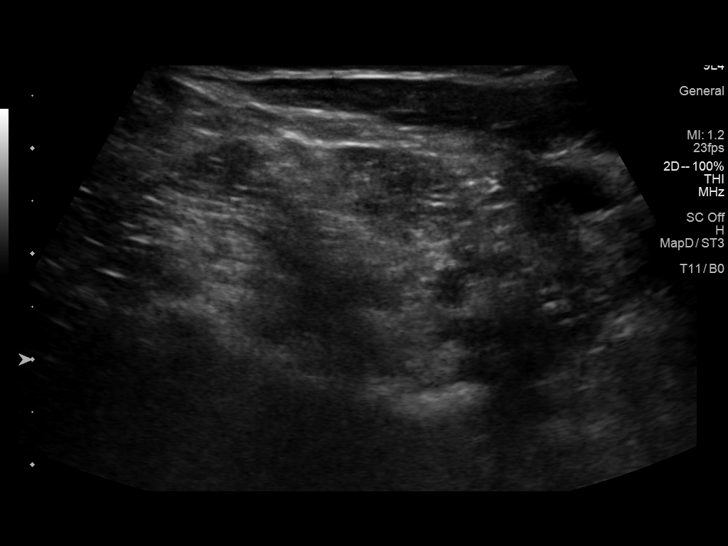
[im 26/41]
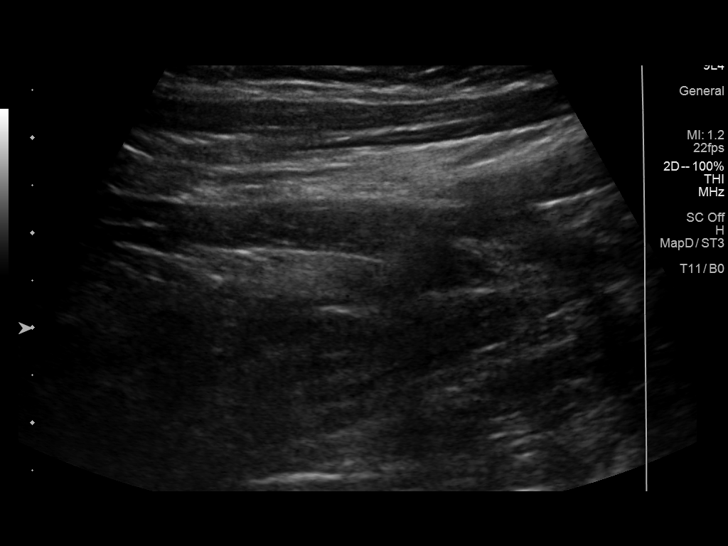
[im 27/41]
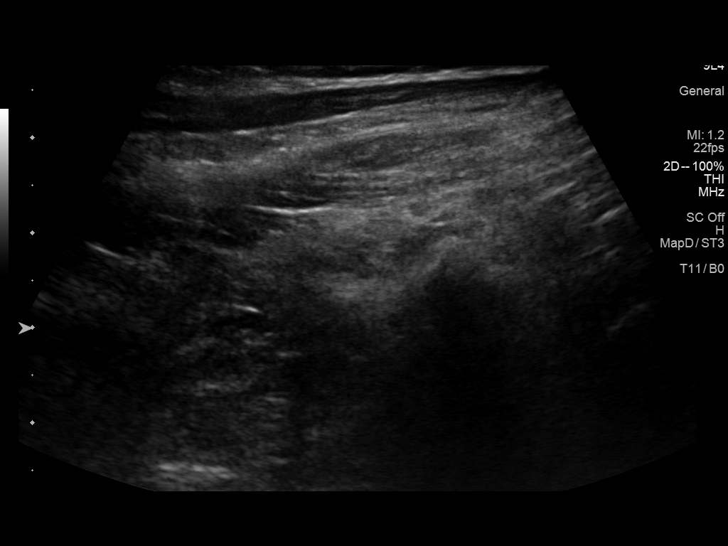
[im 31/41]
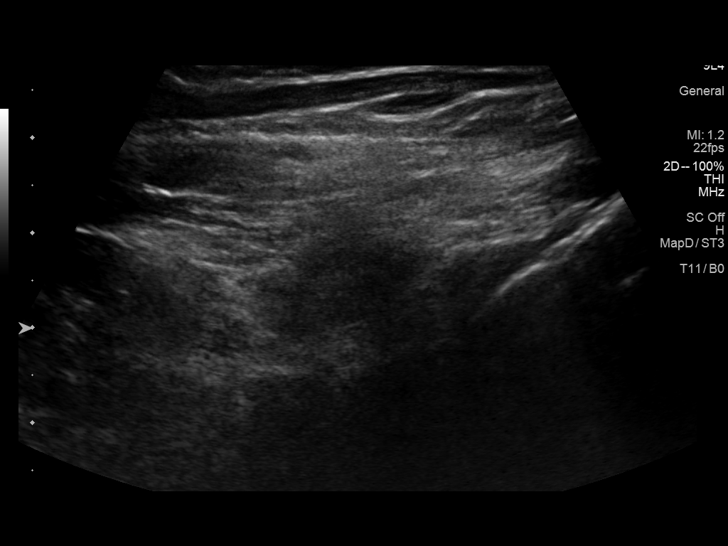
[im 34/41]
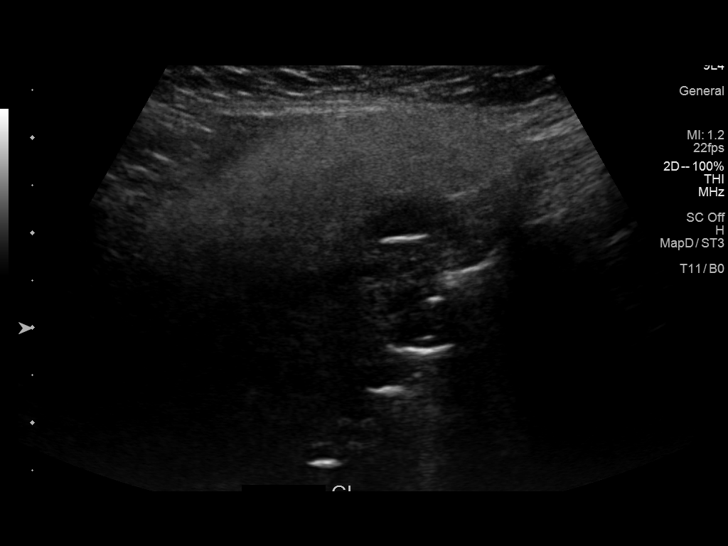
[im 37/41]
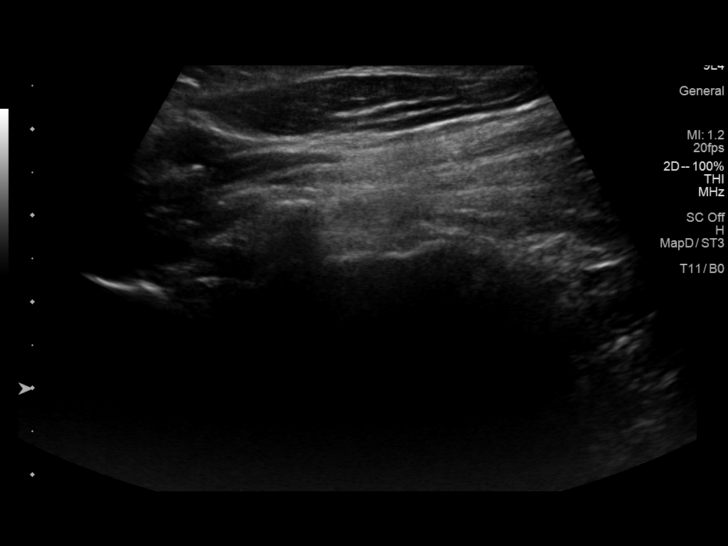
[im 41/41]
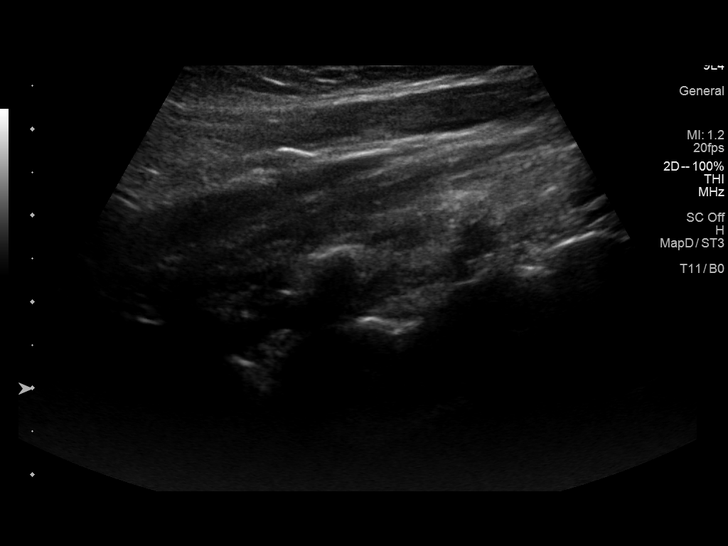

[14 of 25 positions shown; findings below may reference images not displayed]

FINDINGS: Postsurgical changes after total thyroidectomy. No evidence of soft
tissue nodularity in the thyroid bed. No cervical lymphadenopathy.
IMPRESSION: Postsurgical changes after total thyroidectomy without evidence of
local recurrence or regional lymphadenopathy. No sonographic
abnormality in the visualized portion of the neck to explain
reported symptoms.

## 2022-12-15 ENCOUNTER — Ambulatory Visit: Payer: Self-pay

## 2022-12-18 DIAGNOSIS — N760 Acute vaginitis: Secondary | ICD-10-CM | POA: Diagnosis not present

## 2022-12-18 DIAGNOSIS — Z01419 Encounter for gynecological examination (general) (routine) without abnormal findings: Secondary | ICD-10-CM | POA: Diagnosis not present

## 2022-12-18 DIAGNOSIS — E039 Hypothyroidism, unspecified: Secondary | ICD-10-CM | POA: Diagnosis not present

## 2022-12-18 DIAGNOSIS — Z1272 Encounter for screening for malignant neoplasm of vagina: Secondary | ICD-10-CM | POA: Diagnosis not present

## 2022-12-18 DIAGNOSIS — Z6823 Body mass index (BMI) 23.0-23.9, adult: Secondary | ICD-10-CM | POA: Diagnosis not present

## 2022-12-18 LAB — RESULTS CONSOLE HPV: CHL HPV: NEGATIVE

## 2022-12-18 LAB — HM PAP SMEAR: HM Pap smear: NEGATIVE

## 2023-01-22 DIAGNOSIS — Z1231 Encounter for screening mammogram for malignant neoplasm of breast: Secondary | ICD-10-CM | POA: Diagnosis not present

## 2023-01-22 LAB — HM MAMMOGRAPHY

## 2023-02-06 ENCOUNTER — Encounter: Payer: BC Managed Care – PPO | Admitting: Family Medicine

## 2023-08-07 DIAGNOSIS — N920 Excessive and frequent menstruation with regular cycle: Secondary | ICD-10-CM

## 2023-08-07 DIAGNOSIS — N944 Primary dysmenorrhea: Secondary | ICD-10-CM | POA: Insufficient documentation

## 2023-08-07 DIAGNOSIS — R55 Syncope and collapse: Secondary | ICD-10-CM

## 2023-08-07 HISTORY — DX: Excessive and frequent menstruation with regular cycle: N92.0

## 2023-08-07 HISTORY — DX: Syncope and collapse: R55

## 2023-08-08 ENCOUNTER — Ambulatory Visit: Admitting: General Practice

## 2023-08-08 ENCOUNTER — Encounter: Payer: Self-pay | Admitting: General Practice

## 2023-08-08 VITALS — BP 118/80 | HR 73 | Temp 98.7°F | Ht 64.9 in | Wt 138.0 lb

## 2023-08-08 DIAGNOSIS — F17200 Nicotine dependence, unspecified, uncomplicated: Secondary | ICD-10-CM

## 2023-08-08 DIAGNOSIS — F5105 Insomnia due to other mental disorder: Secondary | ICD-10-CM

## 2023-08-08 DIAGNOSIS — I10 Essential (primary) hypertension: Secondary | ICD-10-CM | POA: Diagnosis not present

## 2023-08-08 DIAGNOSIS — K219 Gastro-esophageal reflux disease without esophagitis: Secondary | ICD-10-CM

## 2023-08-08 DIAGNOSIS — Z9089 Acquired absence of other organs: Secondary | ICD-10-CM

## 2023-08-08 DIAGNOSIS — F418 Other specified anxiety disorders: Secondary | ICD-10-CM

## 2023-08-08 DIAGNOSIS — Z9889 Other specified postprocedural states: Secondary | ICD-10-CM

## 2023-08-08 DIAGNOSIS — Z7689 Persons encountering health services in other specified circumstances: Secondary | ICD-10-CM | POA: Insufficient documentation

## 2023-08-08 DIAGNOSIS — F410 Panic disorder [episodic paroxysmal anxiety] without agoraphobia: Secondary | ICD-10-CM

## 2023-08-08 NOTE — Patient Instructions (Addendum)
 Schedule follow up in 4 weeks for a physical and fasting labs.   Shingles vaccine, prevnar 20 (pneumonia vaccine) and Tdap- if you would like we can provide those for you.   Keep a log of your stomach symptoms and bring back with you to your next appointment.  It was a pleasure meeting you!

## 2023-08-08 NOTE — Assessment & Plan Note (Signed)
 Continue alprazolam  prn.  Controlled.  Discussed wean off and using it sparingly.

## 2023-08-08 NOTE — Assessment & Plan Note (Signed)
 Controlled  Continue Trazodone  75 mg at bedtime.

## 2023-08-08 NOTE — Addendum Note (Signed)
 Addended by: Clarance Cristal B on: 08/08/2023 09:46 AM   Modules accepted: Orders

## 2023-08-08 NOTE — Assessment & Plan Note (Signed)
 Chronic and controlled.   Continue metoprolol  25 mg once daily.  Has been treated with hydrochlorothiazide  but not sure when she stopped it.  BP at goal today.

## 2023-08-08 NOTE — Assessment & Plan Note (Signed)
 EMR reviewed briefly.

## 2023-08-08 NOTE — Progress Notes (Signed)
 New Patient Office Visit  Subjective    Patient ID: Emma James, female    DOB: May 07, 1970  Age: 53 y.o. MRN: [id number]  CC:  Chief Complaint  Patient presents with   New Patient (Initial Visit)    HPI Emma James is a 53 y.o. female presents to establish care and transfer of care.  Last PCP/physical/labs: Ulyess Gammons, NP/ 2023.  HTN: diagnosed several years ago. She is currently managed on metoprolol  succinate 25 mg. She does not recall when she stopped the hydrochlorothiazide . She denies any blurred vision, headaches, chest pain or shortness of breath.   Hx of Thyroidectomy (2021): currently managed on synthroid  125 mcg. Last TSH was checked at her GYN in October and it was normal. She is also taking calcium carbonate and otc calcium to help with hypocalcemia.   Depression with anxiety: diagnosed several years ago. Currently managed on Lexapro  20 mg once daily. Her GYN has been managing it. She does have Alprazolam  0.25 mg PRN. She uses it three times a week to help with sleep. It was originally given to her due to panic attacks in the middle of the night. Denies SI/HI.   Insomnia: Diagnosed many years ago. She is currently managed on Trazodone  75 mg once at bedtime. She has been doing well on it. No concerns. Dr. Serge Dancer provides refills.   Outpatient Encounter Medications as of 08/08/2023  Medication Sig   ALPRAZolam  (NIRAVAM ) 0.25 MG dissolvable tablet TAKE 1 TABLET BY MOUTH EVERY DAY AS NEEDED FOR ANXIETY   calcium carbonate (OSCAL) 1500 (600 Ca) MG TABS tablet Take 600 mg of elemental calcium by mouth daily at 2 am.   calcium carbonate (TUMS - DOSED IN MG ELEMENTAL CALCIUM) 500 MG chewable tablet Chew 1 tablet by mouth daily.   escitalopram  (LEXAPRO ) 20 MG tablet Take 1 tablet (20 mg total) by mouth daily.   escitalopram  (LEXAPRO ) 20 MG tablet Take 1 tablet by mouth daily.   hydrochlorothiazide  (HYDRODIURIL ) 12.5 MG tablet Take 1 tablet (12.5 mg total) by mouth daily.    levothyroxine  (SYNTHROID ) 125 MCG tablet Take 1 tablet (125 mcg total) by mouth daily before breakfast.   metoprolol  succinate (TOPROL -XL) 25 MG 24 hr tablet Take 1 tablet (25 mg total) by mouth daily.   Multiple Vitamins-Minerals (WOMENS 50+ MULTI VITAMIN/MIN PO) Take 1 tablet by mouth daily.   traZODone  (DESYREL ) 150 MG tablet Take 0.5-1 tablets (75-150 mg total) by mouth at bedtime.   No facility-administered encounter medications on file as of 08/08/2023.    Past Medical History:  Diagnosis Date   Abnormal cervical Papanicolaou smear 02/18/2019   Anxiety    Phreesia 10/04/2019   Anxiety and depression    Chest pain 01/15/2019   Depression    Phreesia 10/04/2019   EKG, abnormal 01/15/2019   EKG, abnormal 01/15/2019   Essential hypertension 01/23/2019   Hypertension    Phreesia 10/04/2019   Hypertensive urgency 01/15/2019   Menorrhagia 08/07/2023   Multiple thyroid  nodules 10/17/2017   US  in 10/2017 - needs to be repeated in 2020 for monitoring     Thyroid  disease    Vasovagal syncope 08/07/2023    Past Surgical History:  Procedure Laterality Date   ABDOMINAL HYSTERECTOMY N/A    Phreesia 10/04/2019   THYROIDECTOMY     TOTAL ABDOMINAL HYSTERECTOMY  11/14/2015    Family History  Problem Relation Age of Onset   Heart attack Mother    Melanoma Father    Multiple sclerosis Father  Diabetes Father    Melanoma Maternal Grandmother     Social History   Socioeconomic History   Marital status: Married    Spouse name: Not on file   Number of children: 1   Years of education: Not on file   Highest education level: Not on file  Occupational History   Not on file  Tobacco Use   Smoking status: Every Day    Current packs/day: 1.00    Types: Cigarettes   Smokeless tobacco: Never  Vaping Use   Vaping status: Never Used  Substance and Sexual Activity   Alcohol use: Yes    Alcohol/week: 0.0 standard drinks of alcohol    Comment: occ   Drug use: Never   Sexual  activity: Not Currently  Other Topics Concern   Not on file  Social History Narrative   Lives alone   Works for [employer]   Children - 5 y/o   Programmer, applications - 1   Social Drivers of Corporate investment banker Strain: Not on file  Food Insecurity: Not on file  Transportation Needs: Not on file  Physical Activity: Not on file  Stress: Not on file  Social Connections: Unknown (08/18/2021)   Received from [facility]   Social Network    Social Network: Not on file  Intimate Partner Violence: Unknown (07/10/2021)   Received from [facility]   HITS    Physically Hurt: Not on file    Insult or Talk Down To: Not on file    Threaten Physical Harm: Not on file    Scream or Curse: Not on file    Review of Systems  Constitutional:  Negative for chills and fever.  Respiratory:  Negative for shortness of breath.   Cardiovascular:  Negative for chest pain.  Gastrointestinal:  Positive for diarrhea. Negative for abdominal pain, constipation, heartburn, nausea and vomiting.  Genitourinary:  Negative for dysuria, frequency and urgency.  Neurological:  Negative for dizziness and headaches.  Endo/Heme/Allergies:  Negative for polydipsia.  Psychiatric/Behavioral:  Negative for depression and suicidal ideas. The patient is not nervous/anxious.         Objective    BP 118/80 (BP Location: Left Arm, Patient Position: Sitting, Cuff Size: Normal)   Pulse 73   Temp 98.7 F (37.1 C) (Oral)   Ht 5' 4.9" (1.648 m)   Wt 138 lb (62.6 kg)   LMP 09/06/2015 (Approximate)   SpO2 96%   BMI 23.04 kg/m   Physical Exam Vitals and nursing note reviewed.  Constitutional:      Appearance: Normal appearance.  Cardiovascular:     Rate and Rhythm: Normal rate and regular rhythm.     Pulses: Normal pulses.     Heart sounds: Normal heart sounds.  Pulmonary:     Effort: Pulmonary effort is normal.     Breath sounds: Normal breath sounds.  Abdominal:      General: Abdomen is flat. Bowel sounds are increased. There is no distension.     Palpations: Abdomen is soft. There is no mass.     Tenderness: There is no abdominal tenderness. There is no guarding or rebound.     Hernia: No hernia is present.  Neurological:     Mental Status: She is alert and oriented to person, place, and time.  Psychiatric:        Mood and Affect: Mood normal.        Behavior: Behavior normal.  Thought Content: Thought content normal.        Judgment: Judgment normal.         Assessment & Plan:  Hypertension, unspecified type Assessment & Plan: Chronic and controlled.   Continue metoprolol  25 mg once daily.  Has been treated with hydrochlorothiazide  but not sure when she stopped it.  BP at goal today.    Establishing care with new doctor, encounter for Assessment & Plan: EMR reviewed briefly.    Depression with anxiety Assessment & Plan: Controlled.  Continue Lexapro  20 mg once daily and Alprazolam  0.25 mg PRN.  Discussed wean off and using it sparingly.    Insomnia secondary to depression with anxiety Assessment & Plan: Controlled  Continue Trazodone  75 mg at bedtime.   Laryngopharyngeal reflux (LPR) Assessment & Plan: Resolved.   H/O thyroidectomy Assessment & Plan: No concerns today.   Continue Levothyroxine  125 mg once daily. She is taking it correctly.  Will check thyroid  panel at next visit.   Panic attacks Assessment & Plan: Continue alprazolam  prn.  Controlled.  Discussed wean off and using it sparingly.   Smoker Assessment & Plan: Smoking cessation instruction/counseling given:  counseled patient on the dangers of tobacco use, advised patient to stop smoking, and reviewed strategies to maximize success      Return in about 4 weeks (around 09/05/2023) for physical.   Jolanda Nation, NP

## 2023-08-08 NOTE — Assessment & Plan Note (Signed)
 Controlled.  Continue Lexapro  20 mg once daily and Alprazolam  0.25 mg PRN.  Discussed wean off and using it sparingly.

## 2023-08-08 NOTE — Assessment & Plan Note (Signed)
 Resolved

## 2023-08-08 NOTE — Assessment & Plan Note (Signed)
 No concerns today.   Continue Levothyroxine  125 mg once daily. She is taking it correctly.  Will check thyroid  panel at next visit.

## 2023-08-08 NOTE — Assessment & Plan Note (Signed)
 Smoking cessation instruction/counseling given:  counseled patient on the dangers of tobacco use, advised patient to stop smoking, and reviewed strategies to maximize success

## 2023-09-11 ENCOUNTER — Ambulatory Visit (INDEPENDENT_AMBULATORY_CARE_PROVIDER_SITE_OTHER): Admitting: General Practice

## 2023-09-11 ENCOUNTER — Other Ambulatory Visit: Payer: Self-pay | Admitting: General Practice

## 2023-09-11 ENCOUNTER — Encounter: Payer: Self-pay | Admitting: General Practice

## 2023-09-11 ENCOUNTER — Ambulatory Visit: Payer: Self-pay | Admitting: General Practice

## 2023-09-11 VITALS — BP 120/82 | HR 80 | Temp 98.4°F | Ht 64.9 in | Wt 138.8 lb

## 2023-09-11 DIAGNOSIS — Z9889 Other specified postprocedural states: Secondary | ICD-10-CM | POA: Diagnosis not present

## 2023-09-11 DIAGNOSIS — R7303 Prediabetes: Secondary | ICD-10-CM | POA: Diagnosis not present

## 2023-09-11 DIAGNOSIS — I10 Essential (primary) hypertension: Secondary | ICD-10-CM

## 2023-09-11 DIAGNOSIS — F418 Other specified anxiety disorders: Secondary | ICD-10-CM

## 2023-09-11 DIAGNOSIS — Z1159 Encounter for screening for other viral diseases: Secondary | ICD-10-CM | POA: Diagnosis not present

## 2023-09-11 DIAGNOSIS — Z9089 Acquired absence of other organs: Secondary | ICD-10-CM | POA: Diagnosis not present

## 2023-09-11 DIAGNOSIS — F17200 Nicotine dependence, unspecified, uncomplicated: Secondary | ICD-10-CM

## 2023-09-11 DIAGNOSIS — E782 Mixed hyperlipidemia: Secondary | ICD-10-CM

## 2023-09-11 DIAGNOSIS — Z0000 Encounter for general adult medical examination without abnormal findings: Secondary | ICD-10-CM

## 2023-09-11 DIAGNOSIS — Z1211 Encounter for screening for malignant neoplasm of colon: Secondary | ICD-10-CM | POA: Diagnosis not present

## 2023-09-11 DIAGNOSIS — E042 Nontoxic multinodular goiter: Secondary | ICD-10-CM

## 2023-09-11 LAB — LIPID PANEL
Cholesterol: 242 mg/dL — ABNORMAL HIGH (ref 0–200)
HDL: 46.6 mg/dL (ref >39.00)
LDL Cholesterol: 166 mg/dL — ABNORMAL HIGH (ref 0–99)
NonHDL: 195.2
Total CHOL/HDL Ratio: 5
Triglycerides: 148 mg/dL (ref 0.0–149.0)
VLDL: 29.6 mg/dL (ref 0.0–40.0)

## 2023-09-11 LAB — COMPREHENSIVE METABOLIC PANEL WITH GFR
ALT: 22 U/L (ref 0–35)
AST: 20 U/L (ref 0–37)
Albumin: 4.7 g/dL (ref 3.5–5.2)
Alkaline Phosphatase: 57 U/L (ref 39–117)
BUN: 14 mg/dL (ref 6–23)
CO2: 30 meq/L (ref 19–32)
Calcium: 8.8 mg/dL (ref 8.4–10.5)
Chloride: 101 meq/L (ref 96–112)
Creatinine, Ser: 0.62 mg/dL (ref 0.40–1.20)
GFR: 103.58 mL/min (ref >60.00)
Glucose, Bld: 90 mg/dL (ref 70–99)
Potassium: 4.3 meq/L (ref 3.5–5.1)
Sodium: 139 meq/L (ref 135–145)
Total Bilirubin: 0.5 mg/dL (ref 0.2–1.2)
Total Protein: 7 g/dL (ref 6.0–8.3)

## 2023-09-11 LAB — CBC
HCT: 43.3 % (ref 36.0–46.0)
Hemoglobin: 14.6 g/dL (ref 12.0–15.0)
MCHC: 33.7 g/dL (ref 30.0–36.0)
MCV: 87.3 fl (ref 78.0–100.0)
Platelets: 237 10*3/uL (ref 150.0–400.0)
RBC: 4.95 Mil/uL (ref 3.87–5.11)
RDW: 13.4 % (ref 11.5–15.5)
WBC: 6 10*3/uL (ref 4.0–10.5)

## 2023-09-11 LAB — HEMOGLOBIN A1C: Hgb A1c MFr Bld: 5.8 % (ref 4.6–6.5)

## 2023-09-11 LAB — T3, FREE: T3, Free: 3.9 pg/mL (ref 2.3–4.2)

## 2023-09-11 LAB — T4, FREE: Free T4: 1.24 ng/dL (ref 0.60–1.60)

## 2023-09-11 LAB — TSH: TSH: 0.3 u[IU]/mL — ABNORMAL LOW (ref 0.35–5.50)

## 2023-09-11 MED ORDER — LEVOTHYROXINE SODIUM 112 MCG PO TABS: 112.0000 ug | ORAL_TABLET | Freq: Every day | ORAL | 0 refills | Status: DC

## 2023-09-11 MED ORDER — ROSUVASTATIN CALCIUM 5 MG PO TABS: 5.0000 mg | ORAL_TABLET | Freq: Every day | ORAL | 0 refills | Status: DC

## 2023-09-11 NOTE — Assessment & Plan Note (Signed)
 Controlled.  Continue Lexapro  20 mg once daily and Alprazolam  0.25 mg PRN.

## 2023-09-11 NOTE — Assessment & Plan Note (Signed)
 Smoking cessation instruction/counseling given:  counseled patient on the dangers of tobacco use, advised patient to stop smoking, and reviewed strategies to maximize success

## 2023-09-11 NOTE — Telephone Encounter (Signed)
 Copied from CRM [id number]. Topic: Clinical - Lab/Test Results >> Sep 11, 2023  1:47 PM [place] J wrote: Reason for CRM: Patient is returning a phone call for lab results. I was able to successfully relay results to the patient. She stated that she is in agreement with starting a low dose of medication as well as monitoring her diet for her cholesterol. She also said that her OBGYN is managing her levothyroxine  and was wondering if Jolanda Nation could lower that medication for her, as it is difficult getting in touch with her OBGYN.

## 2023-09-11 NOTE — Assessment & Plan Note (Signed)
 TSH, T3 and T4 pending.

## 2023-09-11 NOTE — Assessment & Plan Note (Signed)
 At goal.  Continue Metoprolol  25 mg once daily.  Declines refill.

## 2023-09-11 NOTE — Progress Notes (Signed)
 Established Patient Office Visit  Subjective   Patient ID: Emma James, female    DOB: July 19, 1970  Age: 53 y.o. MRN: [id number]  Chief Complaint  Patient presents with   Annual Exam    HPI  Emma James is a 53 year old female with past medical history of HTN, Laryngopharyngeal reflux, depression with anxiety, insomnia, smoker, panic attacks, s/p thyroidectomy presents today for complete physical and follow up of chronic conditions.  Immunizations: -Tetanus: due, declines -Shingles: due, declines -Pneumonia: due, declines  Diet: Fair diet.  Exercise: No regular exercise.  Eye exam: Completes annually  Dental exam: Completes semi-annually    Pap Smear: Completed in October with Dr. Serge James Mammogram: Completed in 2024; due in October, 2025  Colonoscopy: due    Patient Active Problem List   Diagnosis Date Noted   Encounter for screening and preventative care 09/11/2023   Prediabetes 09/11/2023   Establishing care with new doctor, encounter for 08/08/2023   Panic attacks 09/07/2021   H/O thyroidectomy 09/07/2021   Laryngopharyngeal reflux (LPR) 06/16/2019   Smoker 06/16/2019   Hypertension 01/23/2019   Depression with anxiety 01/25/2015   Insomnia secondary to depression with anxiety 01/25/2015   Past Medical History:  Diagnosis Date   Abnormal cervical Papanicolaou smear 02/18/2019   Anxiety    [employer] 10/04/2019   Anxiety and depression    Chest pain 01/15/2019   Depression    [employer] 10/04/2019   EKG, abnormal 01/15/2019   EKG, abnormal 01/15/2019   Essential hypertension 01/23/2019   Genital warts    History of chicken pox    Hypertension    [employer] 10/04/2019   Hypertensive urgency 01/15/2019   Menorrhagia 08/07/2023   Multiple thyroid  nodules 10/17/2017   US  in 10/2017 - needs to be repeated in 2020 for monitoring     Thyroid  disease    Vasovagal syncope 08/07/2023   Past Surgical History:  Procedure Laterality Date   ABDOMINAL HYSTERECTOMY  N/A    [employer] 10/04/2019   THYROIDECTOMY     TOTAL ABDOMINAL HYSTERECTOMY  11/14/2015   Allergies  Allergen Reactions   Codeine Nausea And Vomiting   Fluconazole Nausea Only    Diflucan   Sulfa Antibiotics Other (See Comments)    Hallucinations,  Hallucinations,    Latex Nausea Only, Other (See Comments) and Palpitations    Will pass out    Penicillins Diarrhea, Rash and Other (See Comments)    Did it involve swelling of the face/tongue/throat, SOB, or low BP? N Did it involve sudden or severe rash/hives, skin peeling, or any reaction on the inside of your mouth or nose? Y Did you need to seek medical attention at a hospital or doctor's office? N When did it last happen?53 years old  If all above answers are "NO", may proceed with cephalosporin use. Did it involve swelling of the face/tongue/throat, SOB, or low BP? N Did it involve sudden or severe rash/hives, skin peeling, or any reaction on the inside of your mouth or nose? Y Did you need to seek medical attention at a hospital or doctor's office? N When did it last happen?53 years old  If all above answers are "NO", may proceed with cephalosporin use.         09/11/2023    8:56 AM 08/08/2023    9:45 AM 09/07/2021   10:30 AM  Depression screen PHQ 2/9  Decreased Interest 0 0 1  Down, Depressed, Hopeless 1 1 1   PHQ - 2 Score 1 1 2  Altered sleeping 2 1 1   Tired, decreased energy 1 1 1   Change in appetite 0 0 1  Feeling bad or failure about yourself  1 1 0  Trouble concentrating 0 0 1  Moving slowly or fidgety/restless 0 0 0  Suicidal thoughts 0 0 0  PHQ-9 Score 5 4 6   Difficult doing work/chores Somewhat difficult Somewhat difficult Not difficult at all       09/11/2023    8:57 AM 08/08/2023    9:46 AM 11/03/2019    2:03 PM 10/06/2019    4:14 PM  GAD 7 : Generalized Anxiety Score  Nervous, Anxious, on Edge 3 1 3 1   Control/stop worrying 3 2 3 2   Worry too much - different things 3 2 3 2   Trouble relaxing 2 1 3 2    Restless 1 1 2  0  Easily annoyed or irritable 2 2 2 1   Afraid - awful might happen 1 1 2  0  Total GAD 7 Score 15 10 18 8   Anxiety Difficulty Somewhat difficult Somewhat difficult Somewhat difficult Not difficult at all      Review of Systems  Constitutional:  Negative for chills, fever, malaise/fatigue and weight loss.  HENT:  Negative for congestion, ear discharge, ear pain, hearing loss, nosebleeds, sinus pain, sore throat and tinnitus.   Eyes:  Negative for blurred vision, double vision, pain, discharge and redness.  Respiratory:  Negative for cough, shortness of breath, wheezing and stridor.   Cardiovascular:  Negative for chest pain, palpitations and leg swelling.  Gastrointestinal:  Negative for abdominal pain, constipation, diarrhea, heartburn, nausea and vomiting.  Genitourinary:  Negative for dysuria, frequency and urgency.  Musculoskeletal:  Negative for myalgias.  Skin:  Negative for rash.  Neurological:  Negative for dizziness, tingling, seizures, weakness and headaches.  Psychiatric/Behavioral:  Negative for depression, substance abuse and suicidal ideas. The patient is not nervous/anxious.       Objective:     BP 120/82 (BP Location: Left Arm, Patient Position: Sitting, Cuff Size: Normal)   Pulse 80   Temp 98.4 F (36.9 C) (Oral)   Ht 5' 4.9" (1.648 m)   Wt 138 lb 12.8 oz (63 kg)   LMP 09/06/2015 (Approximate)   SpO2 97%   BMI 23.17 kg/m  BP Readings from Last 3 Encounters:  09/11/23 120/82  08/08/23 118/80  02/28/22 (!) 153/95   Wt Readings from Last 3 Encounters:  09/11/23 138 lb 12.8 oz (63 kg)  08/08/23 138 lb (62.6 kg)  02/28/22 145 lb (65.8 kg)      Physical Exam Vitals and nursing note reviewed.  Constitutional:      Appearance: Normal appearance.  HENT:     Head: Normocephalic and atraumatic.     Right Ear: Tympanic membrane, ear canal and external ear normal.     Left Ear: Tympanic membrane, ear canal and external ear normal.     Nose:  Nose normal.     Mouth/Throat:     Mouth: Mucous membranes are moist.     Pharynx: Oropharynx is clear.  Eyes:     Conjunctiva/sclera: Conjunctivae normal.     Pupils: Pupils are equal, round, and reactive to light.  Cardiovascular:     Rate and Rhythm: Normal rate and regular rhythm.     Pulses: Normal pulses.     Heart sounds: Normal heart sounds.  Pulmonary:     Effort: Pulmonary effort is normal.     Breath sounds: Normal breath sounds.  Abdominal:  General: Abdomen is flat. Bowel sounds are normal.     Palpations: Abdomen is soft.  Musculoskeletal:        General: Normal range of motion.     Cervical back: Normal range of motion.  Skin:    General: Skin is warm and dry.     Capillary Refill: Capillary refill takes less than 2 seconds.  Neurological:     General: No focal deficit present.     Mental Status: She is alert and oriented to person, place, and time. Mental status is at baseline.     Deep Tendon Reflexes:     Reflex Scores:      Patellar reflexes are 2+ on the right side and 2+ on the left side. Psychiatric:        Mood and Affect: Mood normal.        Behavior: Behavior normal.        Thought Content: Thought content normal.        Judgment: Judgment normal.      No results found for any visits on 09/11/23.     The 10-year ASCVD risk score (Arnett DK, et al., 2019) is: 7.3%    Assessment & Plan:  Encounter for screening and preventative care Assessment & Plan: Immunizations declines Pap smear UTD. Mammogram due, GYN office. Colonoscopy due   Discussed the importance of a healthy diet and regular exercise in order for weight loss, and to reduce the risk of further co-morbidity.  Exam stable. Labs pending.  Follow up in 1 year for repeat physical.   Orders: -     CBC -     Comprehensive metabolic panel with GFR -     Lipid panel -     TSH -     Hemoglobin A1c  Encounter for hepatitis C screening test for low risk patient -      Hepatitis C antibody  Screening for colon cancer -     Ambulatory referral to Gastroenterology  H/O thyroidectomy Assessment & Plan: TSH, T3 and T4 pending.  Orders: -     Lipid panel -     TSH -     T3, free -     T4, free  Prediabetes Assessment & Plan: Hemoglobin A1c pending.  Orders: -     Hemoglobin A1c  Depression with anxiety Assessment & Plan: Controlled.  Continue Lexapro  20 mg once daily and Alprazolam  0.25 mg PRN.    Hypertension, unspecified type Assessment & Plan: At goal.  Continue Metoprolol  25 mg once daily.  Declines refill.    Smoker Assessment & Plan: Smoking cessation instruction/counseling given:  counseled patient on the dangers of tobacco use, advised patient to stop smoking, and reviewed strategies to maximize success       Return in about 6 months (around 03/12/2024) for hypertension and thyroid .    Jolanda Nation, NP

## 2023-09-11 NOTE — Assessment & Plan Note (Addendum)
 Immunizations declines Pap smear UTD. Mammogram due, GYN office. Colonoscopy due   Discussed the importance of a healthy diet and regular exercise in order for weight loss, and to reduce the risk of further co-morbidity.  Exam stable. Labs pending.  Follow up in 1 year for repeat physical.

## 2023-09-11 NOTE — Progress Notes (Signed)
 Called and discussed labs at length with patient.  Also discussed the patients syncopal episode that occurred earlier r/t to hypoglycemia.  Advised patient to go to the ER if it occurs again.   Agreeable to start rosuvastatin 5 mg once daily. Rx sent. Lab order placed for 2 months for check lfts and kidneys.   Agreeable to decrease levothyroxine . Rx sent for levothyroxine  112 mcg once daily. Order placed for 2 months for TSH.  -Jolanda Nation, DNP, AGNP-C 09/11/2023 4:30 PM

## 2023-09-11 NOTE — Patient Instructions (Addendum)
 Stop by the lab prior to leaving today. I will notify you of your results once received.   You will either be contacted via phone regarding your referral to gastroenterology , or you may receive a letter on your MyChart portal from our referral team with instructions for scheduling an appointment. Please let us  know if you have not been contacted by anyone within two weeks.  Follow up in 6 months.   It was a pleasure to see you today!

## 2023-09-11 NOTE — Assessment & Plan Note (Signed)
 Hemoglobin A1c pending

## 2023-09-12 LAB — HEPATITIS C ANTIBODY: Hepatitis C Ab: NONREACTIVE

## 2023-10-01 ENCOUNTER — Other Ambulatory Visit: Payer: Self-pay | Admitting: Student

## 2023-10-01 DIAGNOSIS — K921 Melena: Secondary | ICD-10-CM | POA: Diagnosis not present

## 2023-10-01 DIAGNOSIS — I10 Essential (primary) hypertension: Secondary | ICD-10-CM | POA: Diagnosis not present

## 2023-10-01 DIAGNOSIS — K529 Noninfective gastroenteritis and colitis, unspecified: Secondary | ICD-10-CM | POA: Diagnosis not present

## 2023-10-01 DIAGNOSIS — R109 Unspecified abdominal pain: Secondary | ICD-10-CM | POA: Diagnosis not present

## 2023-10-02 ENCOUNTER — Ambulatory Visit
Admission: RE | Admit: 2023-10-02 | Discharge: 2023-10-02 | Disposition: A | Discharge status: Home / Self Care | Source: Ambulatory Visit | Attending: Student | Admitting: Student

## 2023-10-02 DIAGNOSIS — R109 Unspecified abdominal pain: Secondary | ICD-10-CM | POA: Diagnosis not present

## 2023-10-16 DIAGNOSIS — R197 Diarrhea, unspecified: Secondary | ICD-10-CM | POA: Diagnosis not present

## 2023-10-16 DIAGNOSIS — K449 Diaphragmatic hernia without obstruction or gangrene: Secondary | ICD-10-CM | POA: Diagnosis not present

## 2023-10-16 DIAGNOSIS — R1012 Left upper quadrant pain: Secondary | ICD-10-CM | POA: Diagnosis not present

## 2023-10-16 DIAGNOSIS — K921 Melena: Secondary | ICD-10-CM | POA: Diagnosis not present

## 2023-10-16 DIAGNOSIS — K2970 Gastritis, unspecified, without bleeding: Secondary | ICD-10-CM | POA: Diagnosis not present

## 2023-10-16 DIAGNOSIS — K319 Disease of stomach and duodenum, unspecified: Secondary | ICD-10-CM | POA: Diagnosis not present

## 2023-11-07 ENCOUNTER — Other Ambulatory Visit: Payer: Self-pay | Admitting: General Practice

## 2023-11-07 DIAGNOSIS — E042 Nontoxic multinodular goiter: Secondary | ICD-10-CM

## 2023-11-08 NOTE — Telephone Encounter (Signed)
 Labs will be checked on 11/12/23; okay to fill now or wait for results?

## 2023-11-12 ENCOUNTER — Other Ambulatory Visit (INDEPENDENT_AMBULATORY_CARE_PROVIDER_SITE_OTHER): Payer: Self-pay

## 2023-11-12 DIAGNOSIS — E042 Nontoxic multinodular goiter: Secondary | ICD-10-CM

## 2023-11-12 DIAGNOSIS — E782 Mixed hyperlipidemia: Secondary | ICD-10-CM | POA: Diagnosis not present

## 2023-11-13 ENCOUNTER — Ambulatory Visit: Payer: Self-pay | Admitting: General Practice

## 2023-11-13 LAB — COMPREHENSIVE METABOLIC PANEL WITH GFR
ALT: 19 U/L (ref 0–35)
AST: 18 U/L (ref 0–37)
Albumin: 4.4 g/dL (ref 3.5–5.2)
Alkaline Phosphatase: 59 U/L (ref 39–117)
BUN: 13 mg/dL (ref 6–23)
CO2: 29 meq/L (ref 19–32)
Calcium: 8 mg/dL — ABNORMAL LOW (ref 8.4–10.5)
Chloride: 101 meq/L (ref 96–112)
Creatinine, Ser: 0.63 mg/dL (ref 0.40–1.20)
GFR: 103.06 mL/min (ref >60.00)
Glucose, Bld: 121 mg/dL — ABNORMAL HIGH (ref 70–99)
Potassium: 4 meq/L (ref 3.5–5.1)
Sodium: 138 meq/L (ref 135–145)
Total Bilirubin: 0.2 mg/dL (ref 0.2–1.2)
Total Protein: 6.5 g/dL (ref 6.0–8.3)

## 2023-11-13 LAB — TSH: TSH: 1.3 u[IU]/mL (ref 0.35–5.50)

## 2023-11-22 DIAGNOSIS — K529 Noninfective gastroenteritis and colitis, unspecified: Secondary | ICD-10-CM | POA: Diagnosis not present

## 2023-12-04 DIAGNOSIS — R197 Diarrhea, unspecified: Secondary | ICD-10-CM | POA: Diagnosis not present

## 2023-12-04 DIAGNOSIS — K219 Gastro-esophageal reflux disease without esophagitis: Secondary | ICD-10-CM | POA: Diagnosis not present

## 2023-12-08 ENCOUNTER — Other Ambulatory Visit: Payer: Self-pay | Admitting: Nurse Practitioner

## 2023-12-08 ENCOUNTER — Other Ambulatory Visit: Payer: Self-pay | Admitting: General Practice

## 2023-12-08 DIAGNOSIS — E782 Mixed hyperlipidemia: Secondary | ICD-10-CM

## 2023-12-08 DIAGNOSIS — E042 Nontoxic multinodular goiter: Secondary | ICD-10-CM

## 2024-01-07 ENCOUNTER — Other Ambulatory Visit: Payer: Self-pay | Admitting: Family

## 2024-01-07 DIAGNOSIS — E042 Nontoxic multinodular goiter: Secondary | ICD-10-CM

## 2024-01-08 NOTE — Telephone Encounter (Signed)
 Copied from CRM [id number]. Topic: Clinical - Medication Question >> Jan 08, 2024  9:43 AM Franky GRADE wrote: Reason for CRM: Patient placed a refill through her pharmacy for levothyroxine  (SYNTHROID ) 112 MCG tablet [[id number]] and they haven't gotten a response. Patient is concerned because she only has one tablet let. [id number] [address] [place],  [phone] [address]

## 2024-01-22 ENCOUNTER — Emergency Department (HOSPITAL_COMMUNITY)

## 2024-01-22 ENCOUNTER — Encounter (HOSPITAL_COMMUNITY): Payer: Self-pay

## 2024-01-22 ENCOUNTER — Other Ambulatory Visit: Payer: Self-pay

## 2024-01-22 ENCOUNTER — Emergency Department (HOSPITAL_COMMUNITY)
Admission: EM | Admit: 2024-01-22 | Discharge: 2024-01-22 | Disposition: A | Discharge status: Home / Self Care | Attending: Emergency Medicine | Admitting: Emergency Medicine

## 2024-01-22 DIAGNOSIS — R42 Dizziness and giddiness: Secondary | ICD-10-CM | POA: Diagnosis not present

## 2024-01-22 DIAGNOSIS — I10 Essential (primary) hypertension: Secondary | ICD-10-CM | POA: Insufficient documentation

## 2024-01-22 DIAGNOSIS — Z79899 Other long term (current) drug therapy: Secondary | ICD-10-CM | POA: Insufficient documentation

## 2024-01-22 DIAGNOSIS — G4489 Other headache syndrome: Secondary | ICD-10-CM | POA: Diagnosis not present

## 2024-01-22 DIAGNOSIS — R519 Headache, unspecified: Secondary | ICD-10-CM | POA: Diagnosis not present

## 2024-01-22 DIAGNOSIS — R079 Chest pain, unspecified: Secondary | ICD-10-CM | POA: Diagnosis not present

## 2024-01-22 DIAGNOSIS — Z91040 Latex allergy status: Secondary | ICD-10-CM | POA: Insufficient documentation

## 2024-01-22 DIAGNOSIS — F17200 Nicotine dependence, unspecified, uncomplicated: Secondary | ICD-10-CM | POA: Diagnosis not present

## 2024-01-22 DIAGNOSIS — R072 Precordial pain: Secondary | ICD-10-CM | POA: Diagnosis not present

## 2024-01-22 LAB — CBC
HCT: 41.9 % (ref 36.0–46.0)
Hemoglobin: 13.9 g/dL (ref 12.0–15.0)
MCH: 29.6 pg (ref 26.0–34.0)
MCHC: 33.2 g/dL (ref 30.0–36.0)
MCV: 89.3 fL (ref 80.0–100.0)
Platelets: 226 K/uL (ref 150–400)
RBC: 4.69 MIL/uL (ref 3.87–5.11)
RDW: 12.7 % (ref 11.5–15.5)
WBC: 7.4 K/uL (ref 4.0–10.5)
nRBC: 0 % (ref 0.0–0.2)

## 2024-01-22 LAB — BASIC METABOLIC PANEL WITH GFR
Anion gap: 12 (ref 5–15)
BUN: 13 mg/dL (ref 6–20)
CO2: 24 mmol/L (ref 22–32)
Calcium: 8.1 mg/dL — ABNORMAL LOW (ref 8.9–10.3)
Chloride: 102 mmol/L (ref 98–111)
Creatinine, Ser: 0.69 mg/dL (ref 0.44–1.00)
GFR, Estimated: >60 mL/min (ref >60)
Glucose, Bld: 79 mg/dL (ref 70–99)
Potassium: 3.5 mmol/L (ref 3.5–5.1)
Sodium: 138 mmol/L (ref 135–145)

## 2024-01-22 LAB — D-DIMER, QUANTITATIVE: D-Dimer, Quant: <0.27 ug{FEU}/mL (ref 0.00–0.50)

## 2024-01-22 LAB — TROPONIN I (HIGH SENSITIVITY)
Troponin I (High Sensitivity): 2 ng/L (ref <18)
Troponin I (High Sensitivity): 3 ng/L (ref <18)

## 2024-01-22 LAB — CBG MONITORING, ED: Glucose-Capillary: 97 mg/dL (ref 70–99)

## 2024-01-22 NOTE — ED Triage Notes (Signed)
 Patient BIB [facility] from home for CP starting around 1500 then headache and dizziness also started, no hx of migraine or headache, endorses hx of HTN and high cholesterol.  Was intermittently HTN with EMS, down with nitroglycerin  to 140/90 BP 173/98 down to 140/90 with NTG HR 84  RR 16 100% RA 20 L wrist.

## 2024-01-22 NOTE — ED Provider Notes (Addendum)
 [place] EMERGENCY DEPARTMENT AT [facility] Provider Note   CSN: [id number] Arrival date & time: 01/22/24  [id number]     Patient presents with: Chest Pain, Dizziness, and Headache   Emma James is a 53 y.o. female.   Somewhere between 2 and 3 in the afternoon patient developed substernal chest pain and then radiated towards the left chest area.  EMS gave her nitroglycerin  and aspirin  and the pain went completely away.  But now it is come back but not as severe.  After the onset of the chest pain maybe about 30 minutes later developed headache that was initially fairly severe some dizziness as well.  No history of migraines no history of headaches.  Patient endorses a history of hypertension and high cholesterol these would be risk factors no known cardiac disease.  Patient denies any shortness of breath.  Patient states though that the chest pain is back but not as severe as it was originally.  EKG without any acute findings.  Past medical history significant for hypertension total abdominal hysterectomy patient is an everyday smoker.  Oxygen saturations are upper 90s to 100% on room air.       Prior to Admission medications   Medication Sig Start Date End Date Taking? Authorizing Provider  ALPRAZolam  (NIRAVAM ) 0.25 MG dissolvable tablet TAKE 1 TABLET BY MOUTH EVERY DAY AS NEEDED FOR ANXIETY 10/31/21   Kip Ade, NP  calcium  carbonate (OSCAL) 1500 (600 Ca) MG TABS tablet Take 600 mg of elemental calcium  by mouth daily at 2 am.    [provider]  calcium  carbonate (TUMS - DOSED IN MG ELEMENTAL CALCIUM ) 500 MG chewable tablet Chew 1 tablet by mouth daily.    [provider]  escitalopram  (LEXAPRO ) 20 MG tablet Take 1 tablet (20 mg total) by mouth daily. 09/07/21   Kip Ade, NP  levothyroxine  (SYNTHROID ) 112 MCG tablet TAKE 1 TABLET BY MOUTH EVERY DAY BEFORE BREAKFAST 01/08/24   Vincente Shivers, NP  metoprolol  succinate (TOPROL -XL) 25 MG 24 hr tablet Take 1  tablet (25 mg total) by mouth daily. 09/07/21   Kip Ade, NP  Multiple Vitamins-Minerals (WOMENS 50+ MULTI VITAMIN/MIN PO) Take 1 tablet by mouth daily.    [provider]  rosuvastatin  (CRESTOR ) 5 MG tablet TAKE 1 TABLET (5 MG TOTAL) BY MOUTH DAILY. 12/10/23   Vincente Shivers, NP  traZODone  (DESYREL ) 150 MG tablet Take 0.5-1 tablets (75-150 mg total) by mouth at bedtime. 09/07/21   Kip Ade, NP    Allergies: Codeine, Fluconazole, Sulfa antibiotics, Latex, and Penicillins    Review of Systems  Constitutional:  Negative for chills and fever.  HENT:  Negative for ear pain and sore throat.   Eyes:  Negative for pain and visual disturbance.  Respiratory:  Negative for cough and shortness of breath.   Cardiovascular:  Positive for chest pain. Negative for palpitations and leg swelling.  Gastrointestinal:  Negative for abdominal pain and vomiting.  Genitourinary:  Negative for dysuria and hematuria.  Musculoskeletal:  Negative for arthralgias and back pain.  Skin:  Negative for color change and rash.  Neurological:  Positive for dizziness, light-headedness and headaches. Negative for seizures and syncope.  All other systems reviewed and are negative.   Updated Vital Signs BP (!) 157/97 (BP Location: Right Arm)   Pulse 78   Temp 98.9 F (37.2 C) (Oral)   Resp 18   LMP 09/06/2015 (Approximate)   SpO2 99%   Physical Exam Vitals and nursing note reviewed.  Constitutional:      General: She is not in acute distress.    Appearance: Normal appearance. She is well-developed. She is not ill-appearing.  HENT:     Head: Normocephalic and atraumatic.  Eyes:     Conjunctiva/sclera: Conjunctivae normal.     Pupils: Pupils are equal, round, and reactive to light.  Cardiovascular:     Rate and Rhythm: Normal rate and regular rhythm.     Heart sounds: No murmur heard. Pulmonary:     Effort: Pulmonary effort is normal. No respiratory distress.     Breath sounds: Normal breath  sounds.  Abdominal:     Palpations: Abdomen is soft.     Tenderness: There is no abdominal tenderness.  Musculoskeletal:        General: No swelling.     Cervical back: Neck supple.     Right lower leg: No edema.     Left lower leg: No edema.  Skin:    General: Skin is warm and dry.     Capillary Refill: Capillary refill takes less than 2 seconds.  Neurological:     General: No focal deficit present.     Mental Status: She is alert and oriented to person, place, and time.  Psychiatric:        Mood and Affect: Mood normal.     (all labs ordered are listed, but only abnormal results are displayed) Labs Reviewed  BASIC METABOLIC PANEL WITH GFR - Abnormal; Notable for the following components:      Result Value   Calcium  8.1 (*)    All other components within normal limits  CBC  D-DIMER, QUANTITATIVE  TROPONIN I (HIGH SENSITIVITY)  TROPONIN I (HIGH SENSITIVITY)    EKG: EKG Interpretation Date/Time:  Wednesday January 22 2024 16:45:00 EDT Ventricular Rate:  82 PR Interval:  158 QRS Duration:  76 QT Interval:  418 QTC Calculation: 488 R Axis:   75  Text Interpretation: Normal sinus rhythm Nonspecific ST abnormality Prolonged QT Abnormal ECG When compared with ECG of 15-Jan-2019 14:25, PREVIOUS ECG IS PRESENT Confirmed by Kewon Statler [phone]) on 01/22/2024 5:21:37 PM  Radiology: [facility] Chest 2 View Result Date: 01/22/2024 EXAM: 2 VIEW(S) XRAY OF THE CHEST 01/22/2024 05:41:00 PM COMPARISON: 01/15/19 CLINICAL HISTORY: chest pain, Dizziness; Headache FINDINGS: LUNGS AND PLEURA: No focal pulmonary opacity. No pulmonary edema. No pleural effusion. No pneumothorax. HEART AND MEDIASTINUM: No acute abnormality of the cardiac and mediastinal silhouettes. BONES AND SOFT TISSUES: No acute osseous abnormality. IMPRESSION: 1. No acute cardiopulmonary process identified. Electronically signed by: Norman Gatlin MD 01/22/2024 05:53 PM EDT RP Workstation: [id number]     Procedures    Medications Ordered in the ED - No data to display                                  Medical Decision Making Amount and/or Complexity of Data Reviewed Labs: ordered. Radiology: ordered.   EKG without any acute findings.  Basic metabolic panel normal CBC normal hemoglobin 13.9.  White count 7.4.  Initial troponin was 2 needs delta troponin.  Chest x-ray without any acute findings.  Patient needs delta troponin will add on D-dimer.  Will get CT head because of the onset of the headache and dizziness.  Headache is significantly improved.  D-dimer very normal it does 0.27.  So no concerns about PE.  Troponins x 2 normal first 1 was 2 and third 1 was  3.  CT head without any acute findings.  There was evidence of a possible small sphenoid sinus polyp would you can follow-up with your regular doctor with about.  Also will have you follow-up with cardiology.  Final diagnoses:  Precordial pain  Primary hypertension    ED Discharge Orders     None          Geraldene Hamilton, MD 01/22/24 [id number]    Geraldene Hamilton, MD 01/22/24 2138

## 2024-01-22 NOTE — Discharge Instructions (Signed)
 Recommend taking a baby aspirin  a day.  Today's workup no signs of blood clots.  Head CT was negative.  The troponin heart markers both were normal.  Make an appointment to follow-up with cardiology for further evaluation.  Make an appointment to follow-up with your primary care doctor for blood pressure checks.  But blood pressure has improved here.  Return for any new or worse symptoms.

## 2024-02-12 DIAGNOSIS — Z1231 Encounter for screening mammogram for malignant neoplasm of breast: Secondary | ICD-10-CM | POA: Diagnosis not present

## 2024-02-25 DIAGNOSIS — K219 Gastro-esophageal reflux disease without esophagitis: Secondary | ICD-10-CM | POA: Diagnosis not present

## 2024-02-25 DIAGNOSIS — K529 Noninfective gastroenteritis and colitis, unspecified: Secondary | ICD-10-CM | POA: Diagnosis not present

## 2024-02-25 DIAGNOSIS — R109 Unspecified abdominal pain: Secondary | ICD-10-CM | POA: Diagnosis not present

## 2024-03-12 ENCOUNTER — Ambulatory Visit: Payer: Self-pay | Admitting: General Practice

## 2024-03-19 ENCOUNTER — Ambulatory Visit
Admission: RE | Admit: 2024-03-19 | Discharge: 2024-03-19 | Disposition: A | Discharge status: Home / Self Care | Source: Ambulatory Visit

## 2024-03-19 VITALS — BP 117/80 | HR 80 | Temp 99.0°F | Resp 20

## 2024-03-19 DIAGNOSIS — B349 Viral infection, unspecified: Secondary | ICD-10-CM | POA: Diagnosis not present

## 2024-03-19 DIAGNOSIS — J0100 Acute maxillary sinusitis, unspecified: Secondary | ICD-10-CM

## 2024-03-19 LAB — POC COVID19/FLU A&B COMBO
Covid Antigen, POC: NEGATIVE
Influenza A Antigen, POC: NEGATIVE
Influenza B Antigen, POC: NEGATIVE

## 2024-03-19 MED ORDER — PREDNISONE 10 MG (21) PO TBPK: ORAL_TABLET | Freq: Every day | ORAL | 0 refills | Status: AC

## 2024-03-19 MED ORDER — AZITHROMYCIN 250 MG PO TABS: 250.0000 mg | ORAL_TABLET | Freq: Every day | ORAL | 0 refills | Status: DC

## 2024-03-19 MED ORDER — IPRATROPIUM BROMIDE 0.03 % NA SOLN: 2.0000 | Freq: Two times a day (BID) | NASAL | 0 refills | Status: AC

## 2024-03-19 NOTE — ED Provider Notes (Signed)
 CAY RALPH PELT    CSN: [id number] Arrival date & time: 03/19/24  1010      History   Chief Complaint Chief Complaint  Patient presents with   Fever    Entered by patient   Sore Throat   Otalgia   Cough   Nasal Congestion    HPI Emma James is a 53 y.o. female.   Patient presents for evaluation of a fever peaking at 101, nasal congestion, left-sided sinus pressure and ear pain, sore throat and a nonproductive cough present for 2 days.  Tolerable to food and liquids but appetite is decreased due to altered taste.  Has attempted use of Tylenol  and Motrin.  No known sick contacts.  Denies shortness of breath or wheezing.     Past Medical History:  Diagnosis Date   Abnormal cervical Papanicolaou smear 02/18/2019   Anxiety    Phreesia 10/04/2019   Anxiety and depression    Chest pain 01/15/2019   Depression    Phreesia 10/04/2019   EKG, abnormal 01/15/2019   EKG, abnormal 01/15/2019   Essential hypertension 01/23/2019   Genital warts    History of chicken pox    Hypertension    Phreesia 10/04/2019   Hypertensive urgency 01/15/2019   Menorrhagia 08/07/2023   Multiple thyroid  nodules 10/17/2017   US  in 10/2017 - needs to be repeated in 2020 for monitoring     Thyroid  disease    Vasovagal syncope 08/07/2023    Patient Active Problem List   Diagnosis Date Noted   Encounter for screening and preventative care 09/11/2023   Prediabetes 09/11/2023   Establishing care with new doctor, encounter for 08/08/2023   Panic attacks 09/07/2021   H/O thyroidectomy 09/07/2021   Laryngopharyngeal reflux (LPR) 06/16/2019   Smoker 06/16/2019   Hypertension 01/23/2019   Depression with anxiety 01/25/2015   Insomnia secondary to depression with anxiety 01/25/2015    Past Surgical History:  Procedure Laterality Date   ABDOMINAL HYSTERECTOMY N/A    Phreesia 10/04/2019   THYROIDECTOMY     TOTAL ABDOMINAL HYSTERECTOMY  11/14/2015    OB History   No obstetric history on  file.      Home Medications    Prior to Admission medications  Medication Sig Start Date End Date Taking? Authorizing Provider  colestipol (COLESTID) 1 g tablet Take 2 g by mouth daily. 12/04/23  Yes [provider]  dicyclomine (BENTYL) 20 MG tablet Take 20 mg by mouth 3 (three) times daily. 02/25/24  Yes [provider]  ALPRAZolam  (NIRAVAM ) 0.25 MG dissolvable tablet TAKE 1 TABLET BY MOUTH EVERY DAY AS NEEDED FOR ANXIETY 10/31/21   Kip Ade, NP  calcium  carbonate (OSCAL) 1500 (600 Ca) MG TABS tablet Take 600 mg of elemental calcium  by mouth daily at 2 am.    [provider]  calcium  carbonate (TUMS - DOSED IN MG ELEMENTAL CALCIUM ) 500 MG chewable tablet Chew 1 tablet by mouth daily.    [provider]  escitalopram  (LEXAPRO ) 20 MG tablet Take 1 tablet (20 mg total) by mouth daily. 09/07/21   Kip Ade, NP  levothyroxine  (SYNTHROID ) 112 MCG tablet TAKE 1 TABLET BY MOUTH EVERY DAY BEFORE BREAKFAST 01/08/24   Vincente Shivers, NP  metoprolol  succinate (TOPROL -XL) 25 MG 24 hr tablet Take 1 tablet (25 mg total) by mouth daily. 09/07/21   Kip Ade, NP  Multiple Vitamins-Minerals (WOMENS 50+ MULTI VITAMIN/MIN PO) Take 1 tablet by mouth daily.    [provider]  rosuvastatin  (CRESTOR ) 5 MG  tablet TAKE 1 TABLET (5 MG TOTAL) BY MOUTH DAILY. 12/10/23   Vincente Shivers, NP  traZODone  (DESYREL ) 150 MG tablet Take 0.5-1 tablets (75-150 mg total) by mouth at bedtime. 09/07/21   Kip Ade, NP    Family History Family History  Problem Relation Age of Onset   Hypertension Mother    Hyperlipidemia Mother    Depression Mother    Arthritis Mother    Heart attack Mother    Hearing loss Father    Depression Father    Cancer Father    Melanoma Father    Multiple sclerosis Father    Diabetes Father    Hypertension Sister    Drug abuse Sister    Alcohol abuse Sister    Depression Sister    Depression Brother    Mental illness Brother    Drug  abuse Daughter    Asthma Daughter    Early death Maternal Grandmother    Cancer Maternal Grandmother    Alcohol abuse Maternal Grandmother    Melanoma Maternal Grandmother    Hypertension Maternal Grandfather    Heart disease Maternal Grandfather    Heart attack Maternal Grandfather    Alcohol abuse Maternal Grandfather    Stroke Paternal Grandmother    Alcohol abuse Paternal Grandfather     Social History Social History[1]   Allergies   Codeine, Fluconazole, Sulfa antibiotics, Latex, and Penicillins   Review of Systems Review of Systems  Constitutional:  Positive for fever. Negative for activity change, appetite change, chills, diaphoresis, fatigue and unexpected weight change.  HENT:  Positive for congestion, ear pain, sinus pain and sore throat. Negative for dental problem, drooling, ear discharge, facial swelling, hearing loss, mouth sores, nosebleeds, postnasal drip, rhinorrhea, sinus pressure, sneezing, tinnitus, trouble swallowing and voice change.   Respiratory:  Positive for cough. Negative for apnea, choking, chest tightness, shortness of breath, wheezing and stridor.   Gastrointestinal: Negative.   Neurological: Negative.      Physical Exam Triage Vital Signs ED Triage Vitals  Encounter Vitals Group     BP 03/19/24 1044 117/80     Girls Systolic BP Percentile --      Girls Diastolic BP Percentile --      Boys Systolic BP Percentile --      Boys Diastolic BP Percentile --      Pulse Rate 03/19/24 1044 80     Resp 03/19/24 1044 20     Temp 03/19/24 1044 99 F (37.2 C)     Temp Source 03/19/24 1044 Oral     SpO2 03/19/24 1044 97 %     Weight --      Height --      Head Circumference --      Peak Flow --      Pain Score 03/19/24 1041 6     Pain Loc --      Pain Education --      Exclude from Growth Chart --    No data found.  Updated Vital Signs BP 117/80 (BP Location: Left Arm)   Pulse 80   Temp 99 F (37.2 C) (Oral)   Resp 20   LMP 09/06/2015    SpO2 97%   Visual Acuity Right Eye Distance:   Left Eye Distance:   Bilateral Distance:    Right Eye Near:   Left Eye Near:    Bilateral Near:     Physical Exam Constitutional:      Appearance: Normal appearance.  HENT:  Right Ear: Tympanic membrane, ear canal and external ear normal.     Left Ear: Tympanic membrane, ear canal and external ear normal.     Nose: Congestion present.     Right Sinus: No maxillary sinus tenderness or frontal sinus tenderness.     Left Sinus: Maxillary sinus tenderness and frontal sinus tenderness present.     Mouth/Throat:     Pharynx: No oropharyngeal exudate or posterior oropharyngeal erythema.  Cardiovascular:     Rate and Rhythm: Normal rate and regular rhythm.     Pulses: Normal pulses.     Heart sounds: Normal heart sounds.  Pulmonary:     Effort: Pulmonary effort is normal.     Breath sounds: Normal breath sounds.  Neurological:     Mental Status: She is alert.      UC Treatments / Results  Labs (all labs ordered are listed, but only abnormal results are displayed) Labs Reviewed  POC COVID19/FLU A&B COMBO    EKG   Radiology No results found.  Procedures Procedures (including critical care time)  Medications Ordered in UC Medications - No data to display  Initial Impression / Assessment and Plan / UC Course  I have reviewed the triage vital signs and the nursing notes.  Pertinent labs & imaging results that were available during my care of the patient were reviewed by me and considered in my medical decision making (see chart for details).  Acute nonrecurrent maxillary sinusitis, viral illness  Patient is in no signs of distress nor toxic appearing.  Vital signs are stable.  Low suspicion for pneumonia, pneumothorax or bronchitis and therefore will defer imaging.  COVID and flu testing negative.  Prescribed prednisone and ipratropium nasal spray, watch and wait antibiotic placed at pharmacy.May use additional  over-the-counter medications as needed for supportive care.  May follow-up with urgent care as needed if symptoms persist or worsen.  Note given.   Final Clinical Impressions(s) / UC Diagnoses   Final diagnoses:  Viral illness   Discharge Instructions   None    ED Prescriptions   None    PDMP not reviewed this encounter.     [1]  Social History Tobacco Use   Smoking status: Every Day    Current packs/day: 1.00    Types: Cigarettes   Smokeless tobacco: Never  Vaping Use   Vaping status: Never Used  Substance Use Topics   Alcohol use: Yes    Alcohol/week: 0.0 standard drinks of alcohol    Comment: occ   Drug use: Never     Teresa Shelba SAUNDERS, NP 03/19/24 1245

## 2024-03-19 NOTE — Discharge Instructions (Addendum)
 Today you are being treated for a sinus infection most likely caused by a virus, typically viruses can persist for 7 to 10 days before full resolution, please be patient with your body, if no improvement by Tuesday you may pick up azithromycin from the pharmacy for treatment of any bacteria  Begin prednisone every morning with food to reduce inflammation which helps to reduce sinus pressure, avoid ibuprofen while taking but may use Tylenol   May use nasal spray additionally to further help reduce sinus pressure and congestion  You can take Tylenol  as needed for fever reduction and pain relief.   For cough: honey 1/2 to 1 teaspoon (you can dilute the honey in water or another fluid).  You can also use guaifenesin and dextromethorphan for cough. You can use a humidifier for chest congestion and cough.  If you don't have a humidifier, you can sit in the bathroom with the hot shower running.      For sore throat: try warm salt water gargles, cepacol lozenges, throat spray, warm tea or water with lemon/honey, popsicles or ice, or OTC cold relief medicine for throat discomfort.   For congestion: take a daily anti-histamine like Zyrtec, Claritin, and a oral decongestant, such as pseudoephedrine.  You can also use Flonase  1-2 sprays in each nostril daily.   It is important to stay hydrated: drink plenty of fluids (water, gatorade/powerade/pedialyte, juices, or teas) to keep your throat moisturized and help further relieve irritation/discomfort.

## 2024-03-19 NOTE — ED Triage Notes (Signed)
 Patient reports fever, left ear pain, headache, nasal congestion and productive cough color of mucus unknown x 2 days. Patient has been alternating Tylenol  and Motrin for fever with mild relief. Rates ear pain 6/10 and headache 7/10.

## 2024-03-24 ENCOUNTER — Ambulatory Visit: Payer: Self-pay | Admitting: General Practice

## 2024-03-24 ENCOUNTER — Encounter: Payer: Self-pay | Admitting: General Practice

## 2024-03-24 VITALS — BP 122/82 | HR 76 | Temp 98.4°F | Ht 64.9 in | Wt 135.0 lb

## 2024-03-24 DIAGNOSIS — R7303 Prediabetes: Secondary | ICD-10-CM | POA: Diagnosis not present

## 2024-03-24 DIAGNOSIS — K219 Gastro-esophageal reflux disease without esophagitis: Secondary | ICD-10-CM

## 2024-03-24 DIAGNOSIS — E782 Mixed hyperlipidemia: Secondary | ICD-10-CM | POA: Diagnosis not present

## 2024-03-24 DIAGNOSIS — Z9089 Acquired absence of other organs: Secondary | ICD-10-CM

## 2024-03-24 DIAGNOSIS — F5105 Insomnia due to other mental disorder: Secondary | ICD-10-CM

## 2024-03-24 DIAGNOSIS — F410 Panic disorder [episodic paroxysmal anxiety] without agoraphobia: Secondary | ICD-10-CM

## 2024-03-24 DIAGNOSIS — I10 Essential (primary) hypertension: Secondary | ICD-10-CM | POA: Diagnosis not present

## 2024-03-24 DIAGNOSIS — Z9889 Other specified postprocedural states: Secondary | ICD-10-CM

## 2024-03-24 DIAGNOSIS — F17200 Nicotine dependence, unspecified, uncomplicated: Secondary | ICD-10-CM

## 2024-03-24 DIAGNOSIS — F418 Other specified anxiety disorders: Secondary | ICD-10-CM

## 2024-03-24 DIAGNOSIS — E042 Nontoxic multinodular goiter: Secondary | ICD-10-CM

## 2024-03-24 DIAGNOSIS — Z7185 Encounter for immunization safety counseling: Secondary | ICD-10-CM

## 2024-03-24 LAB — COMPREHENSIVE METABOLIC PANEL WITH GFR
ALT: 24 U/L (ref 3–35)
AST: 19 U/L (ref 5–37)
Albumin: 4.6 g/dL (ref 3.5–5.2)
Alkaline Phosphatase: 55 U/L (ref 39–117)
BUN: 15 mg/dL (ref 6–23)
CO2: 29 meq/L (ref 19–32)
Calcium: 8.4 mg/dL (ref 8.4–10.5)
Chloride: 99 meq/L (ref 96–112)
Creatinine, Ser: 0.63 mg/dL (ref 0.40–1.20)
GFR: 102.79 mL/min (ref >60.00)
Glucose, Bld: 116 mg/dL — ABNORMAL HIGH (ref 70–99)
Potassium: 3.9 meq/L (ref 3.5–5.1)
Sodium: 138 meq/L (ref 135–145)
Total Bilirubin: 0.3 mg/dL (ref 0.2–1.2)
Total Protein: 7.5 g/dL (ref 6.0–8.3)

## 2024-03-24 LAB — CBC
HCT: 41.7 % (ref 36.0–46.0)
Hemoglobin: 14.3 g/dL (ref 12.0–15.0)
MCHC: 34.2 g/dL (ref 30.0–36.0)
MCV: 88.1 fl (ref 78.0–100.0)
Platelets: 219 K/uL (ref 150.0–400.0)
RBC: 4.73 Mil/uL (ref 3.87–5.11)
RDW: 13.6 % (ref 11.5–15.5)
WBC: 7.2 K/uL (ref 4.0–10.5)

## 2024-03-24 LAB — LIPID PANEL
Cholesterol: 145 mg/dL (ref 28–200)
HDL: 43.2 mg/dL (ref >39.00)
LDL Cholesterol: 83 mg/dL (ref 10–99)
NonHDL: 101.95
Total CHOL/HDL Ratio: 3
Triglycerides: 95 mg/dL (ref 10.0–149.0)
VLDL: 19 mg/dL (ref 0.0–40.0)

## 2024-03-24 LAB — TSH: TSH: 1.69 u[IU]/mL (ref 0.35–5.50)

## 2024-03-24 LAB — HEMOGLOBIN A1C: Hgb A1c MFr Bld: 5.5 % (ref 4.6–6.5)

## 2024-03-24 MED ORDER — TRAZODONE HCL 150 MG PO TABS: 75.0000 mg | ORAL_TABLET | Freq: Every day | ORAL | 0 refills | Status: AC

## 2024-03-24 MED ORDER — ESCITALOPRAM OXALATE 20 MG PO TABS: 20.0000 mg | ORAL_TABLET | Freq: Every day | ORAL | 0 refills | Status: AC

## 2024-03-24 MED ORDER — METOPROLOL SUCCINATE ER 25 MG PO TB24: 25.0000 mg | ORAL_TABLET | Freq: Every day | ORAL | 0 refills | Status: AC

## 2024-03-24 NOTE — Progress Notes (Signed)
 Established Patient Office Visit  Subjective   Patient ID: Emma James, female    DOB: 19-Sep-1970  Age: 53 y.o. MRN: [id number]  Chief Complaint  Patient presents with   Hypertension    Patient here today for BP f/u and patient states it has been doing well.     HPI  Emma James is a 53 year old female with past medical history of HTN, laryngopharyngeal reflux, depression with anxiety, insomnia, smoker, panic attacks, prediabetes presents today for chronic care management.   Discussed the use of AI scribe software for clinical note transcription with the patient, who gave verbal consent to proceed.  History of Present Illness [name] is a 53 year old female who presents for a six-month follow-up visit.   She has hypertension and takes metoprolol  25 mg once daily. No recent episodes of high or low heart rate. She does not check her BP at home.  Her history of anxiety and depression is managed with Lexapro  and alprazolam , which she tolerates well. She uses alprazolam  as needed, primarily for insomnia, and takes trazodone  150 mg, half a tablet nightly, for sleep.  She has prediabetes with a previous A1c of 5.8% as of June. She is attempting to improve her diet and exercise but experiences occasional lapses, particularly around Halloween.  Following a thyroidectomy, her previously abnormal thyroid  lab results have normalized after medication adjustment. Her thyroid  function will be rechecked today.  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 She notes increased appetite while on prednisone .  She has hyperlipidemia and is on Crestor . Her cholesterol levels were previously elevated, and she is fasting today for a recheck.  She smokes and wants to quit but has not yet done so.    Patient Active Problem List   Diagnosis Date Noted   Vaccine counseling 03/24/2024   Mixed hyperlipidemia 03/24/2024   Encounter for  screening and preventative care 09/11/2023   Prediabetes 09/11/2023   Establishing care with new doctor, encounter for 08/08/2023   Panic attacks 09/07/2021   H/O thyroidectomy 09/07/2021   Laryngopharyngeal reflux (LPR) 06/16/2019   Smoker 06/16/2019   Essential hypertension 01/23/2019   Multinodular thyroid  10/17/2017   Depression with anxiety 01/25/2015   Insomnia secondary to depression with anxiety 01/25/2015   Past Medical History:  Diagnosis Date   Abnormal cervical Papanicolaou smear 02/18/2019   Anxiety    Phreesia 10/04/2019   Anxiety and depression    Chest pain 01/15/2019   Depression    [facility]ia 10/04/2019   EKG, abnormal 01/15/2019   EKG, abnormal 01/15/2019   Essential hypertension 01/23/2019   Genital warts    History of chicken pox    Hypertension    Phreesia 10/04/2019   Hypertensive urgency 01/15/2019   Menorrhagia 08/07/2023   Multiple thyroid  nodules 10/17/2017   US  in 10/2017 - needs to be repeated in 2020 for monitoring     Thyroid  disease    Vasovagal syncope 08/07/2023   Past Surgical History:  Procedure Laterality Date   ABDOMINAL HYSTERECTOMY N/A    Phreesia 10/04/2019   THYROIDECTOMY     TOTAL ABDOMINAL HYSTERECTOMY  11/14/2015   Allergies[1]       03/24/2024   11:13 AM 09/11/2023    8:56 AM 08/08/2023    9:45 AM  Depression screen PHQ 2/9  Decreased Interest 1 0 0  Down, Depressed, Hopeless 1 1 1   PHQ - 2 Score 2 1 1   Altered sleeping  1 2 1   Tired, decreased energy 1 1 1   Change in appetite 0 0 0  Feeling bad or failure about yourself  0 1 1  Trouble concentrating 2 0 0  Moving slowly or fidgety/restless 0 0 0  Suicidal thoughts 0 0 0  PHQ-9 Score 6 5  4    Difficult doing work/chores Somewhat difficult Somewhat difficult Somewhat difficult     Data saved with a previous flowsheet row definition       03/24/2024   11:13 AM 09/11/2023    8:57 AM 08/08/2023    9:46 AM 11/03/2019    2:03 PM  GAD 7 : Generalized Anxiety Score   Nervous, Anxious, on Edge 2 3 1 3   Control/stop worrying 2 3 2 3   Worry too much - different things 2 3 2 3   Trouble relaxing 2 2 1 3   Restless 0 1 1 2   Easily annoyed or irritable 2 2 2 2   Afraid - awful might happen 0 1 1 2   Total GAD 7 Score 10 15 10 18   Anxiety Difficulty Somewhat difficult Somewhat difficult Somewhat difficult Somewhat difficult      Review of Systems  Constitutional:  Negative for chills and fever.  Respiratory:  Negative for shortness of breath.   Cardiovascular:  Negative for chest pain.  Gastrointestinal:  Negative for abdominal pain, constipation, diarrhea, heartburn, nausea and vomiting.  Genitourinary:  Negative for dysuria, frequency and urgency.  Neurological:  Negative for dizziness and headaches.  Endo/Heme/Allergies:  Negative for polydipsia.  Psychiatric/Behavioral:  Negative for depression and suicidal ideas. The patient is not nervous/anxious.       Objective:     BP 122/82   Pulse 76   Temp 98.4 F (36.9 C) (Temporal)   Ht 5' 4.9 (1.648 m)   Wt 135 lb (61.2 kg)   LMP 09/06/2015   SpO2 97%   BMI 22.53 kg/m  BP Readings from Last 3 Encounters:  03/24/24 122/82  03/19/24 117/80  01/22/24 128/82   Wt Readings from Last 3 Encounters:  03/24/24 135 lb (61.2 kg)  09/11/23 138 lb 12.8 oz (63 kg)  08/08/23 138 lb (62.6 kg)      Physical Exam Vitals and nursing note reviewed.  Constitutional:      Appearance: Normal appearance.  Cardiovascular:     Rate and Rhythm: Normal rate and regular rhythm.     Pulses: Normal pulses.     Heart sounds: Normal heart sounds.  Pulmonary:     Effort: Pulmonary effort is normal.     Breath sounds: Normal breath sounds.  Neurological:     Mental Status: She is alert and oriented to person, place, and time.  Psychiatric:        Mood and Affect: Mood normal.        Behavior: Behavior normal.        Thought Content: Thought content normal.        Judgment: Judgment normal.      No  results found for any visits on 03/24/24.     The 10-year ASCVD risk score (Arnett DK, et al., 2019) is: 7.5%    Assessment & Plan:  Hypertension, unspecified type -     Comprehensive metabolic panel with GFR -     CBC  Multinodular thyroid   Laryngopharyngeal reflux (LPR)  Depression with anxiety -     Escitalopram  Oxalate; Take 1 tablet (20 mg total) by mouth daily.  Dispense: 90 tablet; Refill: 0  Insomnia secondary  to depression with anxiety -     traZODone  HCl; Take 0.5-1 tablets (75-150 mg total) by mouth at bedtime.  Dispense: 90 tablet; Refill: 0  Smoker  Panic attacks  Prediabetes -     Hemoglobin A1c  Mixed hyperlipidemia -     Lipid panel  Essential hypertension -     Metoprolol  Succinate ER; Take 1 tablet (25 mg total) by mouth daily.  Dispense: 90 tablet; Refill: 0  Vaccine counseling  H/O thyroidectomy -     TSH    Assessment and Plan Assessment & Plan Essential hypertension Blood pressure well-controlled on current medication. - Continue metoprolol  25 mg once daily.  Depression with anxiety and insomnia Managed with Lexapro , alprazolam , and trazodone . Tolerating well. - Sent refill for Lexapro . - Continue alprazolam  as needed for sleep. - Sent refill for trazodone .  Mixed hyperlipidemia Previous cholesterol elevated. On Crestor . Fasting required for accurate assessment. - Ordered lipid panel. - Continue Crestor .  Prediabetes A1c was 5.8% in June. Discussed dietary habits.  History of thyroidectomy with thyroid  hormone replacement Medication adjustment normalized thyroid  levels. - Ordered TSH test.  Nicotine dependence Continues to smoke. Discussed risks and cessation aids. - Encouraged smoking cessation. - Offered assistance with smoking cessation aids if needed.  General health maintenance Declined immunizations.   Return in about 6 months (around 09/22/2024) for physical and fasting labs. SABRA Carrol Aurora, NP     [1]   Allergies Allergen Reactions   Codeine Nausea And Vomiting   Fluconazole Nausea Only    Diflucan   Sulfa Antibiotics Other (See Comments)    Hallucinations,  Hallucinations,    Latex Nausea Only, Other (See Comments) and Palpitations    Will pass out    Penicillins Diarrhea, Rash and Other (See Comments)    Did it involve swelling of the face/tongue/throat, SOB, or low BP? N Did it involve sudden or severe rash/hives, skin peeling, or any reaction on the inside of your mouth or nose? Y Did you need to seek medical attention at a hospital or doctor's office? N When did it last happen?53 years old  If all above answers are NO, may proceed with cephalosporin use. Did it involve swelling of the face/tongue/throat, SOB, or low BP? N Did it involve sudden or severe rash/hives, skin peeling, or any reaction on the inside of your mouth or nose? Y Did you need to seek medical attention at a hospital or doctor's office? N When did it last happen?53 years old  If all above answers are NO, may proceed with cephalosporin use.

## 2024-03-24 NOTE — Patient Instructions (Addendum)
 Stop by the lab prior to leaving today. I will notify you of your results once received.    Continue current medication as is.   Follow up in June.   It was a pleasure to see you today!

## 2024-04-06 ENCOUNTER — Other Ambulatory Visit: Payer: Self-pay | Admitting: General Practice

## 2024-04-06 DIAGNOSIS — E042 Nontoxic multinodular goiter: Secondary | ICD-10-CM

## 2024-04-06 MED ORDER — LEVOTHYROXINE SODIUM 112 MCG PO TABS: 112.0000 ug | ORAL_TABLET | Freq: Every day | ORAL | 1 refills | Status: AC

## 2024-04-06 NOTE — Telephone Encounter (Unsigned)
 Copied from CRM #[id number]. Topic: Clinical - Medication Refill >> Apr 06, 2024 12:28 PM Shanda MATSU wrote: Medication: levothyroxine  (SYNTHROID ) 112 MCG tablet  Has the patient contacted their pharmacy? Yes, referred to provider (Agent: If no, request that the patient contact the pharmacy for the refill. If patient does not wish to contact the pharmacy document the reason why and proceed with request.) (Agent: If yes, when and what did the pharmacy advise?)  This is the patient's preferred pharmacy:  [id number] [address] [facility] - [address] [place] [address] Phone: [phone] Fax: [phone]  Is this the correct pharmacy for this prescription? Yes If no, delete pharmacy and type the correct one.   Has the prescription been filled recently? No  Is the patient out of the medication? No  Has the patient been seen for an appointment in the last year OR does the patient have an upcoming appointment? Yes  Can we respond through MyChart? Yes  Agent: Please be advised that Rx refills may take up to 3 business days. We ask that you follow-up with your pharmacy.

## 2024-09-22 ENCOUNTER — Encounter: Admitting: General Practice
